# Patient Record
Sex: Female | Born: 1965 | Race: White | Hispanic: No | Marital: Single | Smoking: Former smoker
Health system: Southern US, Community
[De-identification: ages and names within clinical notes are randomized; demographics above are authoritative.]

## PROBLEM LIST (undated history)

## (undated) DIAGNOSIS — D509 Iron deficiency anemia, unspecified: Secondary | ICD-10-CM

## (undated) DIAGNOSIS — N83209 Unspecified ovarian cyst, unspecified side: Secondary | ICD-10-CM

## (undated) DIAGNOSIS — K8020 Calculus of gallbladder without cholecystitis without obstruction: Secondary | ICD-10-CM

## (undated) DIAGNOSIS — Z87442 Personal history of urinary calculi: Secondary | ICD-10-CM

## (undated) DIAGNOSIS — G894 Chronic pain syndrome: Secondary | ICD-10-CM

## (undated) DIAGNOSIS — E7800 Pure hypercholesterolemia, unspecified: Secondary | ICD-10-CM

## (undated) DIAGNOSIS — M419 Scoliosis, unspecified: Secondary | ICD-10-CM

## (undated) DIAGNOSIS — F329 Major depressive disorder, single episode, unspecified: Secondary | ICD-10-CM

## (undated) DIAGNOSIS — R5383 Other fatigue: Secondary | ICD-10-CM

## (undated) DIAGNOSIS — R252 Cramp and spasm: Secondary | ICD-10-CM

## (undated) DIAGNOSIS — J302 Other seasonal allergic rhinitis: Secondary | ICD-10-CM

## (undated) DIAGNOSIS — M5136 Other intervertebral disc degeneration, lumbar region: Secondary | ICD-10-CM

## (undated) DIAGNOSIS — G43909 Migraine, unspecified, not intractable, without status migrainosus: Secondary | ICD-10-CM

## (undated) DIAGNOSIS — G44209 Tension-type headache, unspecified, not intractable: Secondary | ICD-10-CM

## (undated) DIAGNOSIS — Z720 Tobacco use: Secondary | ICD-10-CM

## (undated) DIAGNOSIS — M51369 Other intervertebral disc degeneration, lumbar region without mention of lumbar back pain or lower extremity pain: Secondary | ICD-10-CM

## (undated) DIAGNOSIS — F32A Depression, unspecified: Secondary | ICD-10-CM

## (undated) DIAGNOSIS — K219 Gastro-esophageal reflux disease without esophagitis: Secondary | ICD-10-CM

## (undated) DIAGNOSIS — M549 Dorsalgia, unspecified: Secondary | ICD-10-CM

## (undated) DIAGNOSIS — R200 Anesthesia of skin: Secondary | ICD-10-CM

## (undated) DIAGNOSIS — K5909 Other constipation: Secondary | ICD-10-CM

## (undated) DIAGNOSIS — F411 Generalized anxiety disorder: Secondary | ICD-10-CM

## (undated) HISTORY — DX: Depression, unspecified: F32.A

## (undated) HISTORY — PX: LAMINECTOMY: SHX219

## (undated) HISTORY — DX: Scoliosis, unspecified: M41.9

## (undated) HISTORY — PX: CHOLECYSTECTOMY: SHX55

## (undated) HISTORY — DX: Gastro-esophageal reflux disease without esophagitis: K21.9

## (undated) HISTORY — DX: Other intervertebral disc degeneration, lumbar region: M51.36

## (undated) HISTORY — DX: Tobacco use: Z72.0

## (undated) HISTORY — PX: MANDIBLE FRACTURE SURGERY: SHX706

## (undated) HISTORY — DX: Other fatigue: R53.83

## (undated) HISTORY — DX: Major depressive disorder, single episode, unspecified: F32.9

## (undated) HISTORY — DX: Other constipation: K59.09

## (undated) HISTORY — DX: Generalized anxiety disorder: F41.1

## (undated) HISTORY — DX: Other intervertebral disc degeneration, lumbar region without mention of lumbar back pain or lower extremity pain: M51.369

## (undated) HISTORY — DX: Iron deficiency anemia, unspecified: D50.9

## (undated) HISTORY — PX: ABDOMINAL HYSTERECTOMY: SHX81

## (undated) HISTORY — PX: LAPAROSCOPIC OVARIAN CYSTECTOMY: SHX6248

## (undated) HISTORY — DX: Other seasonal allergic rhinitis: J30.2

## (undated) HISTORY — DX: Calculus of gallbladder without cholecystitis without obstruction: K80.20

## (undated) HISTORY — DX: Tension-type headache, unspecified, not intractable: G44.209

---

## 2004-09-04 ENCOUNTER — Emergency Department: Payer: Self-pay | Admitting: Emergency Medicine

## 2005-03-08 ENCOUNTER — Emergency Department: Payer: Self-pay | Admitting: Unknown Physician Specialty

## 2005-10-18 ENCOUNTER — Emergency Department: Payer: Self-pay | Admitting: Emergency Medicine

## 2005-11-26 ENCOUNTER — Emergency Department: Payer: Self-pay | Admitting: Emergency Medicine

## 2006-06-14 ENCOUNTER — Emergency Department: Payer: Self-pay | Admitting: Emergency Medicine

## 2006-07-27 ENCOUNTER — Emergency Department: Payer: Self-pay | Admitting: Emergency Medicine

## 2006-08-14 ENCOUNTER — Inpatient Hospital Stay: Payer: Self-pay | Admitting: Psychiatry

## 2007-02-28 ENCOUNTER — Inpatient Hospital Stay: Payer: Self-pay | Admitting: Internal Medicine

## 2007-08-26 ENCOUNTER — Ambulatory Visit: Payer: Self-pay | Admitting: Neurology

## 2007-11-26 ENCOUNTER — Emergency Department: Payer: Self-pay | Admitting: Emergency Medicine

## 2007-11-28 ENCOUNTER — Emergency Department: Payer: Self-pay | Admitting: Emergency Medicine

## 2008-03-14 ENCOUNTER — Inpatient Hospital Stay: Payer: Self-pay | Admitting: Internal Medicine

## 2008-03-14 ENCOUNTER — Other Ambulatory Visit: Payer: Self-pay

## 2008-03-29 ENCOUNTER — Other Ambulatory Visit: Payer: Self-pay

## 2008-03-29 ENCOUNTER — Emergency Department: Payer: Self-pay | Admitting: Emergency Medicine

## 2008-07-05 ENCOUNTER — Emergency Department (HOSPITAL_COMMUNITY): Admission: EM | Admit: 2008-07-05 | Discharge: 2008-07-05 | Payer: Self-pay | Admitting: Emergency Medicine

## 2008-07-25 ENCOUNTER — Ambulatory Visit: Payer: Self-pay | Admitting: Family Medicine

## 2008-08-13 ENCOUNTER — Emergency Department (HOSPITAL_COMMUNITY): Admission: EM | Admit: 2008-08-13 | Discharge: 2008-08-13 | Payer: Self-pay | Admitting: Emergency Medicine

## 2008-08-14 ENCOUNTER — Emergency Department (HOSPITAL_COMMUNITY): Admission: EM | Admit: 2008-08-14 | Discharge: 2008-08-14 | Payer: Self-pay | Admitting: *Deleted

## 2008-08-31 ENCOUNTER — Encounter: Admission: RE | Admit: 2008-08-31 | Discharge: 2008-08-31 | Payer: Self-pay | Admitting: Orthopaedic Surgery

## 2008-09-10 ENCOUNTER — Emergency Department (HOSPITAL_COMMUNITY): Admission: EM | Admit: 2008-09-10 | Discharge: 2008-09-11 | Payer: Self-pay | Admitting: Emergency Medicine

## 2008-09-22 ENCOUNTER — Emergency Department (HOSPITAL_COMMUNITY): Admission: EM | Admit: 2008-09-22 | Discharge: 2008-09-22 | Payer: Self-pay | Admitting: Emergency Medicine

## 2008-09-26 ENCOUNTER — Ambulatory Visit: Payer: Self-pay | Admitting: Family Medicine

## 2008-10-18 ENCOUNTER — Inpatient Hospital Stay (HOSPITAL_COMMUNITY): Admission: EM | Admit: 2008-10-18 | Discharge: 2008-10-20 | Payer: Self-pay | Admitting: Emergency Medicine

## 2008-10-20 ENCOUNTER — Inpatient Hospital Stay (HOSPITAL_COMMUNITY): Admission: RE | Admit: 2008-10-20 | Discharge: 2008-10-22 | Payer: Self-pay | Admitting: *Deleted

## 2008-10-20 ENCOUNTER — Ambulatory Visit: Payer: Self-pay | Admitting: *Deleted

## 2008-12-05 ENCOUNTER — Emergency Department (HOSPITAL_COMMUNITY): Admission: EM | Admit: 2008-12-05 | Discharge: 2008-12-05 | Payer: Self-pay | Admitting: Emergency Medicine

## 2009-05-10 ENCOUNTER — Ambulatory Visit: Payer: Self-pay | Admitting: Family Medicine

## 2009-07-27 ENCOUNTER — Ambulatory Visit: Payer: Self-pay | Admitting: Gastroenterology

## 2009-10-24 ENCOUNTER — Ambulatory Visit: Payer: Self-pay | Admitting: Orthopaedic Surgery

## 2009-11-09 ENCOUNTER — Ambulatory Visit: Payer: Self-pay | Admitting: Gastroenterology

## 2009-11-20 ENCOUNTER — Encounter: Admission: RE | Admit: 2009-11-20 | Discharge: 2009-11-20 | Payer: Self-pay | Admitting: Orthopaedic Surgery

## 2010-04-01 ENCOUNTER — Ambulatory Visit: Payer: Self-pay | Admitting: Internal Medicine

## 2010-04-17 ENCOUNTER — Ambulatory Visit: Payer: Self-pay | Admitting: Internal Medicine

## 2010-04-26 ENCOUNTER — Ambulatory Visit: Payer: Self-pay | Admitting: Internal Medicine

## 2010-06-13 ENCOUNTER — Ambulatory Visit: Payer: Self-pay | Admitting: Surgery

## 2010-06-20 ENCOUNTER — Ambulatory Visit: Payer: Self-pay | Admitting: Surgery

## 2010-09-27 ENCOUNTER — Ambulatory Visit: Payer: Self-pay | Admitting: Orthopaedic Surgery

## 2010-11-14 ENCOUNTER — Emergency Department: Payer: Self-pay | Admitting: Emergency Medicine

## 2010-11-17 ENCOUNTER — Inpatient Hospital Stay: Payer: Self-pay | Admitting: Psychiatry

## 2010-12-12 ENCOUNTER — Emergency Department: Payer: Self-pay | Admitting: Emergency Medicine

## 2010-12-23 ENCOUNTER — Encounter: Payer: Self-pay | Admitting: Neurosurgery

## 2010-12-23 ENCOUNTER — Encounter: Payer: Self-pay | Admitting: Orthopaedic Surgery

## 2011-04-16 NOTE — Discharge Summary (Signed)
Megan Roth, Megan Roth            ACCOUNT NO.:  [id number]   MEDICAL RECORD NO.:  [id number]          PATIENT TYPE:  INP   LOCATION:  IC06                          FACILITY:  [facility]   PHYSICIAN:  Lonia Blood, M.D.      DATE OF BIRTH:  May 22, 1966   DATE OF ADMISSION:  10/18/2008  DATE OF DISCHARGE:  11/19/2009LH                               DISCHARGE SUMMARY   PRIMARY CARE PHYSICIAN:  The patient unassigned to [place].   DISCHARGE DIAGNOSES:  1. Ventilator-dependent respiratory failure secondary to drug      overdose.  2. Drug overdose; presumed OxyContin and benzodiazepines.  3. Hypokalemia.  4. Normocytic anemia.  5. Gastroesophageal reflux disease.  6. Degenerative disk disease of the back with chronic back pain.   DISCHARGE MEDICATIONS:  1. Zanaflex 4 mg 2-3 times daily.  2. Albuterol inhaler as needed.  3. Protonix 40 mg daily.  4. Lamictal 300 mg p.o. b.i.d.  5. Premarin 1.25 mg daily.  6. Klonopin 2 mg p.o. b.i.d.  7. Calcium 1200 mg daily.  8. Vitamin D 400 mg daily.   DISPOSITION:  The patient will be transferred to inpatient psych unit  for further management due to a drug overdose.  She is currently stable.   PROCEDURES PERFORMED:  1. Satisfactory chest x-ray performed on October 18, 2008 showed      endotracheal tube in correct position.  2. Head CT without contrast on the 17th showed normal exam.  3. Another chest x-ray on November 18th showed right main stem      intubation tube and better lung inflation, but no acute pulmonary      findings.   CONSULTATIONS:  1. The ACT Team with psych management.  2. Dr.  Juanetta Gosling for ventilator management.   BRIEF HISTORY AND PHYSICAL:  Please refer to dictated history and  physical by Dr. Dorris Singh on admission.  In short, however, this  is a 45 year old female that was brought into the emergency room  secondary to drug overdose.  The patient was observed by her boyfriend  to take a number of pills, emptied all  medication.  She has been on  OxyContin for low back pain and other medications.  In the ED, the  patient was found to be obtunded.  She was having some respiratory  compromise, and she was intubated instantly for airway protection.  Her  urine drug screen was positive for opiates and enzymes.  She also has  acetaminophen level initially high, but gradually improved to less than  10.  The patient was subsequently admitted to the ICU for further  management.   HOSPITAL COURSE:  1. Ventilatory-dependent respiratory failure.  This was mainly for      respiratory protection.  The patient responded to ventilatory care.      Within 24 hours she was extubated with her respiratory parameters      intact.  She has been awake since then.  Has been eating      adequately, and has been breathing without problems.  2. Drug overdose.  The patient was apparently under a lot of stress.  She attempted suicide by taking multiple pills.  Family have been      around and boyfriend too has been around who actually witnessed      what had happened.  They have up all hours in the hospital.  She      has had some strained relations with her family apparently for a      long time and the boyfriend they have been together for only a      couple of months.  At this point, however, she is still suicidal.      She has been evaluated by the ACT team, and she will be transferred      to inpatient psych unit for further management.  3. Hypokalemia.  This probably came from some nausea and vomiting.      The patient is getting potassium repleted.  4. Normocytic anemia.  Will continue to monitor the patient's      hemoglobin as of this time.  5.  GERD.  The patient is on PPI, and      will continue to treat her with a PPI at this point.  Otherwise,      further management will be done at the psychiatric facility the      patient will be transferred to.      Lonia Blood, M.D.  Electronically Signed      LG/MEDQ  D:  10/20/2008  T:  10/20/2008  Job:  [id number]

## 2011-04-16 NOTE — H&P (Signed)
Megan Roth, Megan Roth            ACCOUNT NO.:  [id number]   MEDICAL RECORD NO.:  [id number]          PATIENT TYPE:  INP   LOCATION:  IC06                          FACILITY:  [facility]   PHYSICIAN:  Dorris Singh, DO    DATE OF BIRTH:  Feb 21, 1966   DATE OF ADMISSION:  10/18/2008  DATE OF DISCHARGE:  LH                              HISTORY & PHYSICAL   CHIEF COMPLAINT:  Witnessed overdose.   HISTORY:  The patient is a 45 year old Caucasian female who presented to  [facility] via EMS due to she was unresponsive, altered mental  status and uncooperative.  The patient's boyfriend, the history was  obtained by him.  He states that today he talked to the patient this  morning about going to an appointment with him and they argued.  He sat  on the bed. While he was sitting on the bed, he heard her open up her  pill bottle and he did not turn around to look at her.  Then he heard  her open up her second pill bottle.  When he turned around to look at  her, he saw her literally dumping her pill bottles all the contents into  her mouth.  Next to her was an empty pill bottle.  There is a suspicion  that she has taken over 80 pills, each one had a minimum of 40 pills per  bottle per boyfriend.   PAST MEDICAL HISTORY:  1. The boyfriend also states that as far as her past medical history,      she does have a history of asthma.  2. Chronic back pain.  3. Degenerative disk disease.  4. Gastroesophageal reflux disease.   SURGICAL HISTORY:  1. She has had back surgery.  2. Hysterectomy.   SOCIAL HISTORY:  She smokes about 1-1/2 packs a day.  She is a  nondrinker.  No drug abuse.  Currently lives with her boyfriend.  She  has 3 daughters.  According to the boyfriend, she is not very close to  her family members.  She does have 1 sister and a brother, but she is  also not close to her children.   ALLERGIES:  1. NEURONTIN.  2. SULFA.   MEDICATIONS:  1. OxyContin, there is no dose  given.  2. Zanaflex.  3. Albuterol inhaler.  4. Prevacid.  5. Tizanidine 4 mg as needed.  6. Lamotrigine 300 mg once a day.   REVIEW OF SYSTEMS:  Cannot assess due to a level 5 caveat.   PHYSICAL EXAMINATION:  VITAL SIGNS:  Heart rate of 48, blood pressure  153/98, respirations 14.  She is currently on the ventilator satting at  100%.  Currently she has a NG tube and an endotracheal tube inserted.  GENERAL:  The patient is a well-developed, well-nourished Caucasian  female.  HEART:  Huston Foley sinus.  No murmur is heard.  LUNGS:  Currently on mechanical ventilation, but clear to auscultation.  ABDOMEN:  Soft, nontender, and nondistended.  EXTREMITIES:  Positive pulses.  No edema, ecchymosis or cyanosis noted.   LABORATORY DATA:  White count of 10.1, hemoglobin  11.4, hematocrit 34.0,  platelet count of 241, sodium is 137, potassium 4.2, chloride 112, CO2  22, glucose 146, BUN 6, creatinine 0.63, albumin is 3.0, calcium 7.6,  total protein 5.4.  Her HCG test is negative.  Her acetaminophen level  was less than 10 and her salicylate level is less than 4.  Her urine  drug screen is positive for benzodiazepines and opiates.  Her urine is  negative with low specific gravity.   ASSESSMENT:  1. Intentional overdose with benzodiazepines and opiates.  2. Respiratory failure.   PLAN:  We will admit the patient to the ICU to the service of [facility].  We will implement the Diprivan protocol as well as contact the ACT team  to come evaluate her when she has been extubated.  We will do DVT and GI  prophylaxis.  We will try to maintain all multiorgan systems by IV  hydration if not medical management if needed.  I have explained to the  boyfriend that this is a very critical situation with the patient.  Due  to the amount of medications that she took, she has an increased  mortality and morbidity.  We will continue to watch the patient and  change management as appropriate.  We will consult Dr.  Juanetta Gosling for  ventilator management.      Dorris Singh, DO  Electronically Signed     CB/MEDQ  D:  10/18/2008  T:  10/18/2008  Job:  [id number]

## 2011-04-16 NOTE — Consult Note (Signed)
NAMEJOICE, NAZARIO            ACCOUNT NO.:  [id number]   MEDICAL RECORD NO.:  [id number]          PATIENT TYPE:  INP   LOCATION:  IC06                          FACILITY:  [facility]   PHYSICIAN:  Edward L. Juanetta Gosling, M.D.DATE OF BIRTH:  Jun 08, 1966   DATE OF CONSULTATION:  DATE OF DISCHARGE:                                 CONSULTATION   REASON FOR CONSULTATION:  Respiratory failure secondary to drug  overdose.   HISTORY:  Ms. Cothron is a 45 year old, who apparently took an overdose  of medications.  She was transported by EMS after her boyfriend found  her.  She was found with empty pill bottles including apparently  OxyContin, benzodiazepine, maybe Darvocet.  Since she came into the  emergency room, she was very unresponsive and she was intubated and  placed on a ventilator.  Her history is all from her medical records.   PAST MEDICAL HISTORY:  Positive for asthma, chronic low back pain,  degenerative disk disease, and gastroesophageal reflux disease.  She has  apparently had some sort of psychiatric history as well, that is not  totally clear.  Surgically, she has had a back surgery and hysterectomy.   SOCIAL HISTORY:  She does not use any alcohol or not use any illicit  drugs.  She apparently does smoke.   She states she is allergic to NEURONTIN and SULFA,. She has been on  multiple medications. I do not have the doses of any of these  medications to speak of OxyContin, Zanaflex, albuterol, Prevacid, and  Lamotrigine.  We do have a dose of 300 mg of that.  It is not clear what  else she has taken.  There is history of taking a number of Darvocet as  well.  She apparently also took Zyrtec.   PHYSICAL EXAMINATION:  GENERAL:  Shows that she is sedated.  She is  intubated.  HEENT:  Her pupils do react, but are small.  VITAL SIGNS:  Her heart rates are in the 40s, her blood pressure 147/92,  respirations are 14, and O2 sats 100%.  CHEST:  Rhonchi bilaterally.  HEART:  Regular  without gallop.  ABDOMEN:  Soft.  Her mucous membranes are moist.  EXTREMITIES:  No edema.  CENTRAL NERVOUS SYSTEM:  Apparently, she had moved all 4 extremities  earlier, but I cannot get much now, because of her sedation.   LABORATORY WORK:  CBC shows white count is 10,100, hemoglobin is 11.4,  and platelet is 241.  Urinalysis is essentially negative.  Urine  pregnancy test is negative.  Urine drug screen positive for opiates,  positive for benzodiazepines.  Acetaminophen level less than 10, which  would argue against her having taken a large amount of Darvocet-N 100.  Comprehensive metabolic profile; glucose is 146.  Electrolytes were  normal and albumin is 3, total protein is 5.4, alcohol less than 5, and  salicylate less than 4.  Blood gas on the ventilator of 100% O2, tidal  volume 500, rate of 14, 5 of PEEP shows pO2 463, pCO2 of 28.2, pH 7.46.   ASSESSMENT:  She has had multiple drug overdose.  She is intubated.  She  is on the ventilator and I do not think we have any opportunity to make  any changes with that now except to reduce her FIO2.  Hopefully, she  will wake up and will be able to look at making some effort of  extubation tomorrow.  I will not be here tomorrow, but will return on  the October 20, 2008.  Thanks for allowing me to see her with you.      Edward L. Juanetta Gosling, M.D.  Electronically Signed     ELH/MEDQ  D:  10/18/2008  T:  10/19/2008  Job:  [id number]

## 2011-04-16 NOTE — Group Therapy Note (Signed)
Megan Roth, Megan Roth            ACCOUNT NO.:  [id number]   MEDICAL RECORD NO.:  [id number]          PATIENT TYPE:  INP   LOCATION:  IC06                          FACILITY:  [facility]   PHYSICIAN:  Edward L. Juanetta Gosling, M.D.DATE OF BIRTH:  [birth year]   DATE OF PROCEDURE:  DATE OF DISCHARGE:                                 PROGRESS NOTE   The patient of the [facility] Hospitalist Team.  Megan Roth was able to  be extubated yesterday and plans are made for her to be transferred to  the [facility] when beds available.  White count 9200,  hemoglobin 9.9, and platelets 182.  Comp metabolic profile shows  potassium is 3.1 and albumin is 2.5.  Her blood gas shows pO2 is 87,  pCO2 of 42, and pH 7.35.   ASSESSMENT:  She has had a overdose.  She is off the ventilator.  Plans  are made for transfer to [facility] and I am going to plan  to sign off.  Thanks for allowing me to see her with you.      Edward L. Juanetta Gosling, M.D.  Electronically Signed     ELH/MEDQ  D:  10/20/2008  T:  10/20/2008  Job:  [id number]
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
## 2011-08-29 ENCOUNTER — Inpatient Hospital Stay: Payer: Self-pay | Admitting: Psychiatry

## 2011-09-03 LAB — URINALYSIS, ROUTINE W REFLEX MICROSCOPIC
Bilirubin Urine: NEGATIVE
Hgb urine dipstick: NEGATIVE
Ketones, ur: NEGATIVE
Protein, ur: NEGATIVE
Urobilinogen, UA: 0.2

## 2011-09-04 LAB — URINE CULTURE
Colony Count: NO GROWTH
Culture: NO GROWTH

## 2011-09-04 LAB — COMPREHENSIVE METABOLIC PANEL
ALT: 15
AST: 16
Albumin: 3 — ABNORMAL LOW
BUN: 6
CO2: 25
Calcium: 7.6 — ABNORMAL LOW
Chloride: 113 — ABNORMAL HIGH
Creatinine, Ser: 0.56
Creatinine, Ser: 0.63
GFR calc Af Amer: >60
GFR calc non Af Amer: >60
Glucose, Bld: 146 — ABNORMAL HIGH
Total Bilirubin: 0.7
Total Protein: 5.4 — ABNORMAL LOW

## 2011-09-04 LAB — CBC
HCT: 34 — ABNORMAL LOW
Hemoglobin: 11.4 — ABNORMAL LOW
Hemoglobin: 9.9 — ABNORMAL LOW
MCHC: 33.5
MCHC: 33.9
MCV: 88.4
MCV: 89.4
MCV: 89.9
RBC: 3.24 — ABNORMAL LOW
RBC: 3.8 — ABNORMAL LOW
RBC: 3.92
RDW: 14.6
RDW: 14.8
WBC: 9.2

## 2011-09-04 LAB — BLOOD GAS, ARTERIAL
Acid-base deficit: 2.7 — ABNORMAL HIGH
Acid-base deficit: 3.8 — ABNORMAL HIGH
Bicarbonate: 19.9 — ABNORMAL LOW
Bicarbonate: 20.7
Bicarbonate: 21.1
FIO2: 40
FIO2: 40
O2 Content: 3
O2 Saturation: 96.3
O2 Saturation: 98.3
O2 Saturation: 98.8
PEEP: 5
Patient temperature: 37
Pressure support: 5
TCO2: 17.8
TCO2: 19.2
TCO2: 19.5
pCO2 arterial: 28.2 — ABNORMAL LOW
pCO2 arterial: 33.3 — ABNORMAL LOW
pH, Arterial: 7.463 — ABNORMAL HIGH
pO2, Arterial: 124 — ABNORMAL HIGH
pO2, Arterial: 137 — ABNORMAL HIGH
pO2, Arterial: 463 — ABNORMAL HIGH

## 2011-09-04 LAB — DIFFERENTIAL
Basophils Absolute: 0
Basophils Absolute: 0.1
Basophils Relative: 0
Basophils Relative: 1
Eosinophils Absolute: 0
Eosinophils Absolute: 0.1
Eosinophils Absolute: 0.1
Eosinophils Relative: 0
Lymphocytes Relative: 34
Lymphs Abs: 3.3
Monocytes Relative: 4
Monocytes Relative: 6
Neutro Abs: 6
Neutrophils Relative %: 60
Neutrophils Relative %: 66

## 2011-09-04 LAB — BASIC METABOLIC PANEL
BUN: 6
CO2: 23
Calcium: 7.5 — ABNORMAL LOW
Creatinine, Ser: 0.61
GFR calc non Af Amer: >60
Glucose, Bld: 84
Sodium: 136

## 2011-09-04 LAB — CARDIAC PANEL(CRET KIN+CKTOT+MB+TROPI)
CK, MB: 1.1
CK, MB: 1.2
CK, MB: 1.4
Relative Index: INVALID
Relative Index: INVALID
Total CK: 55
Total CK: 74
Troponin I: 0.03
Troponin I: 0.07 — ABNORMAL HIGH

## 2011-09-04 LAB — URINALYSIS, ROUTINE W REFLEX MICROSCOPIC
Bilirubin Urine: NEGATIVE
Ketones, ur: NEGATIVE
Nitrite: NEGATIVE
Specific Gravity, Urine: <1.005 — ABNORMAL LOW
Urobilinogen, UA: 0.2
pH: 6.5

## 2011-09-04 LAB — PREGNANCY, URINE

## 2011-09-04 LAB — ACETAMINOPHEN LEVEL

## 2011-09-04 LAB — TRIGLYCERIDES: Triglycerides: 104

## 2011-09-04 LAB — CHOLESTEROL, TOTAL

## 2011-09-04 LAB — RAPID URINE DRUG SCREEN, HOSP PERFORMED: Benzodiazepines: POSITIVE — AB

## 2011-09-04 LAB — PHOSPHORUS: Phosphorus: 2.9

## 2011-09-04 LAB — SALICYLATE LEVEL: Salicylate Lvl: <4

## 2011-10-22 ENCOUNTER — Ambulatory Visit: Payer: Self-pay | Admitting: Neurosurgery

## 2011-10-31 ENCOUNTER — Ambulatory Visit: Payer: Self-pay | Admitting: Internal Medicine

## 2011-11-11 ENCOUNTER — Ambulatory Visit: Payer: Self-pay | Admitting: Internal Medicine

## 2011-11-25 ENCOUNTER — Emergency Department: Payer: Self-pay | Admitting: Internal Medicine

## 2011-11-26 ENCOUNTER — Emergency Department: Payer: Self-pay | Admitting: Emergency Medicine

## 2011-12-18 ENCOUNTER — Emergency Department: Payer: Self-pay

## 2012-01-06 ENCOUNTER — Emergency Department: Payer: Self-pay | Admitting: Emergency Medicine

## 2012-01-06 LAB — DRUG SCREEN, URINE
Amphetamines, Ur Screen: NEGATIVE (ref ?–1000)
Benzodiazepine, Ur Scrn: POSITIVE (ref ?–200)
Cannabinoid 50 Ng, Ur ~~LOC~~: NEGATIVE (ref ?–50)
Tricyclic, Ur Screen: NEGATIVE (ref ?–1000)

## 2012-01-06 LAB — URINALYSIS, COMPLETE
Ketone: NEGATIVE
Nitrite: NEGATIVE
Protein: NEGATIVE

## 2012-01-06 LAB — COMPREHENSIVE METABOLIC PANEL
Alkaline Phosphatase: 100 U/L (ref 50–136)
Anion Gap: 14 (ref 7–16)
Calcium, Total: 8.9 mg/dL (ref 8.5–10.1)
Chloride: 105 mmol/L (ref 98–107)
Co2: 25 mmol/L (ref 21–32)
EGFR (African American): >60
Glucose: 109 mg/dL — ABNORMAL HIGH (ref 65–99)
Osmolality: 284 (ref 275–301)
SGPT (ALT): 12 U/L

## 2012-01-06 LAB — ETHANOL: Ethanol %: <0.003 % (ref 0.000–0.080)

## 2012-01-06 LAB — CBC
HCT: 41.5 % (ref 35.0–47.0)
HGB: 13.9 g/dL (ref 12.0–16.0)
MCH: 28.9 pg (ref 26.0–34.0)
MCHC: 33.4 g/dL (ref 32.0–36.0)
RBC: 4.8 10*6/uL (ref 3.80–5.20)

## 2012-01-06 LAB — TROPONIN I: Troponin-I: <0.02 ng/mL

## 2012-03-31 ENCOUNTER — Emergency Department: Payer: Self-pay | Admitting: Emergency Medicine

## 2012-04-02 ENCOUNTER — Emergency Department: Payer: Self-pay | Admitting: Emergency Medicine

## 2012-09-03 ENCOUNTER — Emergency Department: Payer: Self-pay | Admitting: Emergency Medicine

## 2012-09-03 LAB — LIPASE, BLOOD: Lipase: 193 U/L (ref 73–393)

## 2012-09-03 LAB — COMPREHENSIVE METABOLIC PANEL
Alkaline Phosphatase: 140 U/L — ABNORMAL HIGH (ref 50–136)
Bilirubin,Total: 0.7 mg/dL (ref 0.2–1.0)
Chloride: 104 mmol/L (ref 98–107)
Co2: 22 mmol/L (ref 21–32)
Creatinine: 0.93 mg/dL (ref 0.60–1.30)
EGFR (African American): >60
EGFR (Non-African Amer.): >60
Osmolality: 282 (ref 275–301)
Sodium: 141 mmol/L (ref 136–145)
Total Protein: 8.3 g/dL — ABNORMAL HIGH (ref 6.4–8.2)

## 2012-09-03 LAB — URINALYSIS, COMPLETE
Glucose,UR: 50 mg/dL (ref 0–75)
Nitrite: POSITIVE
Ph: 5 (ref 4.5–8.0)
Protein: NEGATIVE

## 2012-09-03 LAB — CBC
HCT: 45.6 % (ref 35.0–47.0)
HGB: 15.7 g/dL (ref 12.0–16.0)
MCHC: 34.5 g/dL (ref 32.0–36.0)
WBC: 15.3 10*3/uL — ABNORMAL HIGH (ref 3.6–11.0)

## 2012-11-29 ENCOUNTER — Inpatient Hospital Stay: Payer: Self-pay | Admitting: Psychiatry

## 2012-11-29 LAB — CBC
HCT: 44.4 % (ref 35.0–47.0)
HGB: 14.6 g/dL (ref 12.0–16.0)
MCHC: 32.8 g/dL (ref 32.0–36.0)
RBC: 5.18 10*6/uL (ref 3.80–5.20)

## 2012-11-29 LAB — COMPREHENSIVE METABOLIC PANEL
Albumin: 3.6 g/dL (ref 3.4–5.0)
Alkaline Phosphatase: 129 U/L (ref 50–136)
BUN: 10 mg/dL (ref 7–18)
Bilirubin,Total: 0.3 mg/dL (ref 0.2–1.0)
Calcium, Total: 9 mg/dL (ref 8.5–10.1)
Glucose: 113 mg/dL — ABNORMAL HIGH (ref 65–99)
SGOT(AST): 19 U/L (ref 15–37)
SGPT (ALT): 17 U/L (ref 12–78)

## 2012-11-29 LAB — DRUG SCREEN, URINE
Amphetamines, Ur Screen: NEGATIVE (ref ?–1000)
Benzodiazepine, Ur Scrn: POSITIVE (ref ?–200)
Cannabinoid 50 Ng, Ur ~~LOC~~: NEGATIVE (ref ?–50)
Cocaine Metabolite,Ur ~~LOC~~: NEGATIVE (ref ?–300)
MDMA (Ecstasy)Ur Screen: NEGATIVE (ref ?–500)
Opiate, Ur Screen: POSITIVE (ref ?–300)
Phencyclidine (PCP) Ur S: NEGATIVE (ref ?–25)

## 2012-11-29 LAB — URINALYSIS, COMPLETE
Blood: NEGATIVE
Glucose,UR: NEGATIVE mg/dL (ref 0–75)
Leukocyte Esterase: NEGATIVE
Nitrite: NEGATIVE
Protein: NEGATIVE
Specific Gravity: 1.004 (ref 1.003–1.030)
WBC UR: <1 /HPF (ref 0–5)

## 2012-11-29 LAB — ETHANOL: Ethanol: <3 mg/dL

## 2013-02-23 ENCOUNTER — Ambulatory Visit: Payer: Self-pay | Admitting: Internal Medicine

## 2013-09-23 ENCOUNTER — Ambulatory Visit: Payer: Self-pay | Admitting: Internal Medicine

## 2013-10-15 ENCOUNTER — Emergency Department: Payer: Self-pay

## 2013-11-20 ENCOUNTER — Emergency Department: Payer: Self-pay | Admitting: Emergency Medicine

## 2013-11-20 LAB — BASIC METABOLIC PANEL
Anion Gap: 6 — ABNORMAL LOW (ref 7–16)
BUN: 4 mg/dL — ABNORMAL LOW (ref 7–18)
Co2: 32 mmol/L (ref 21–32)
Creatinine: 0.72 mg/dL (ref 0.60–1.30)
EGFR (Non-African Amer.): >60
Glucose: 101 mg/dL — ABNORMAL HIGH (ref 65–99)
Osmolality: 275 (ref 275–301)
Potassium: 3.4 mmol/L — ABNORMAL LOW (ref 3.5–5.1)

## 2013-11-20 LAB — CBC WITH DIFFERENTIAL/PLATELET
Eosinophil #: 0.1 10*3/uL (ref 0.0–0.7)
Eosinophil %: 0.5 %
Lymphocyte #: 3.6 10*3/uL (ref 1.0–3.6)
MCV: 86 fL (ref 80–100)
Monocyte #: 0.7 x10 3/mm (ref 0.2–0.9)
Platelet: 329 10*3/uL (ref 150–440)

## 2013-12-31 ENCOUNTER — Emergency Department: Payer: Self-pay | Admitting: Emergency Medicine

## 2013-12-31 LAB — URINALYSIS, COMPLETE
BILIRUBIN, UR: NEGATIVE
Blood: NEGATIVE
Glucose,UR: NEGATIVE mg/dL (ref 0–75)
Ketone: NEGATIVE
Leukocyte Esterase: NEGATIVE
Nitrite: NEGATIVE
PH: 8 (ref 4.5–8.0)
PROTEIN: NEGATIVE
SPECIFIC GRAVITY: 1.012 (ref 1.003–1.030)
WBC UR: NONE SEEN /HPF (ref 0–5)

## 2013-12-31 LAB — BASIC METABOLIC PANEL
Anion Gap: 10 (ref 7–16)
BUN: 8 mg/dL (ref 7–18)
Calcium, Total: 9.9 mg/dL (ref 8.5–10.1)
Chloride: 105 mmol/L (ref 98–107)
Co2: 23 mmol/L (ref 21–32)
Creatinine: 0.68 mg/dL (ref 0.60–1.30)
EGFR (African American): >60
EGFR (Non-African Amer.): >60
Glucose: 100 mg/dL — ABNORMAL HIGH (ref 65–99)
Osmolality: 274 (ref 275–301)
Potassium: 3.5 mmol/L (ref 3.5–5.1)
Sodium: 138 mmol/L (ref 136–145)

## 2013-12-31 LAB — DRUG SCREEN, URINE
Amphetamines, Ur Screen: NEGATIVE (ref ?–1000)
BENZODIAZEPINE, UR SCRN: POSITIVE (ref ?–200)
Barbiturates, Ur Screen: NEGATIVE (ref ?–200)
COCAINE METABOLITE, UR ~~LOC~~: NEGATIVE (ref ?–300)
Cannabinoid 50 Ng, Ur ~~LOC~~: NEGATIVE (ref ?–50)
MDMA (ECSTASY) UR SCREEN: NEGATIVE (ref ?–500)
Methadone, Ur Screen: NEGATIVE (ref ?–300)
Opiate, Ur Screen: POSITIVE (ref ?–300)
PHENCYCLIDINE (PCP) UR S: NEGATIVE (ref ?–25)
TRICYCLIC, UR SCREEN: POSITIVE (ref ?–1000)

## 2013-12-31 LAB — CBC WITH DIFFERENTIAL/PLATELET
Basophil #: 0.1 10*3/uL (ref 0.0–0.1)
Basophil %: 1.2 %
Eosinophil #: 0 10*3/uL (ref 0.0–0.7)
Eosinophil %: 0.3 %
HCT: 46.7 % (ref 35.0–47.0)
HGB: 15.4 g/dL (ref 12.0–16.0)
Lymphocyte #: 3.3 10*3/uL (ref 1.0–3.6)
Lymphocyte %: 26.4 %
MCH: 28.6 pg (ref 26.0–34.0)
MCHC: 33.1 g/dL (ref 32.0–36.0)
MCV: 87 fL (ref 80–100)
Monocyte #: 0.6 x10 3/mm (ref 0.2–0.9)
Monocyte %: 5 %
Neutrophil #: 8.4 10*3/uL — ABNORMAL HIGH (ref 1.4–6.5)
Neutrophil %: 67.1 %
Platelet: 363 10*3/uL (ref 150–440)
RBC: 5.4 10*6/uL — ABNORMAL HIGH (ref 3.80–5.20)
RDW: 14.9 % — ABNORMAL HIGH (ref 11.5–14.5)
WBC: 12.5 10*3/uL — ABNORMAL HIGH (ref 3.6–11.0)

## 2014-04-12 ENCOUNTER — Emergency Department: Payer: Self-pay | Admitting: Emergency Medicine

## 2014-07-28 ENCOUNTER — Ambulatory Visit: Payer: Self-pay | Admitting: Physician Assistant

## 2014-09-20 ENCOUNTER — Emergency Department: Payer: Self-pay | Admitting: Emergency Medicine

## 2014-10-15 ENCOUNTER — Ambulatory Visit: Payer: Self-pay | Admitting: Pain Medicine

## 2015-02-18 ENCOUNTER — Emergency Department: Payer: Self-pay | Admitting: Emergency Medicine

## 2015-03-24 NOTE — H&P (Signed)
PATIENT NAME:  Megan Roth, Megan Roth MR#:  [id number] DATE OF BIRTH:  Apr 25, 1966  DATE OF ADMISSION:  11/29/2012  INITIAL ASSESSMENT AND PSYCHIATRIC EVALUATION    IDENTIFYING INFORMATION: The patient is a 49 year old white female, not employed and last worked many years ago, divorced for over 46 years and lives by herself.   CHIEF COMPLAINT: The patient comes for admission to [facility] with a chief complaint, "My friend thinks I need help for my nerves." According to information obtained from the Emergency Room, the patient has been stating that she had been depressed for a few weeks and has not been sleeping and denies wanting to hurt herself but wants to go to sleep and never wake up. She has been having lots of problems and is being followed for her chronic pain in the back.   HISTORY OF PRESENT ILLNESS: When the patient was asked when she last felt well, she remembers probably a couple of weeks ago.  She started feeling upset and nervous about certain things in life, and when she was asked about details she said, "payment of bills mostly," getting over events during the holiday season.    PAST PSYCHIATRIC HISTORY: Long history of mental illness.  She was diagnosed with PTSD as well as depression in the past.  She has overdosed and abused narcotics and benzodiazepines in the past.  She admits that she is being followed by Dr. Kasandra Knudsen at [facility], last appointment was a month ago, next appointment is coming up in 1 month.  She was last inpatient at [facility] in September 2012 and was discharged on October 2012 after she was treated by Dr. Weber Cooks.  She was diagnosed with major depressive disorder, recurrent, and she was given medications; however, the patient reports that she has not been taking the medications that she was prescribed.  He prescribed her MS Contin 30 mg twice a day, which was lowered from the dose that she came in; however, she reports that she has  been getting Kadian 60 mg twice a day from [facility] in [place].  In addition, she was restarted on valium 10 mg twice a day, and the patient reports that she has been taking valium 10 mg three times a day. In addition, she is taking tramadol 1 tablet three times a day. Evidently, there is increase in all the pain killers and benzodiazepine medications since her last discharge from the hospital. The patient reports that she is being followed at [facility] [place].  Last appointment was in November 2013.  Next appointment is coming up in January 2014. It looks like all of the medications were increased, especially the opioids and valium. The patient reports that she had 3 back surgeries, and she is in chronic pain.   FAMILY HISTORY OF MENTAL ILLNESS: Sister has mental illness and she is on lithium.  No known history of completed suicides in the family.   SOCIAL HISTORY: Currently she lives by herself, has been divorced for 20 years.  She graduated from high school.  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  She last worked 7 or 8 years ago and has not been employed in many years.   MARRIAGES: She was married once and divorced for 20 years.  She has 3 children.  They are grown and gone.  She is not in touch with her children and  lives by herself.   MEDICAL HISTORY:  She had 3 back surgeries and is in chronic pain.  She has GERD and takes Premarin on a regular basis.   MEDICATIONS: At the time of discharge from [facility] on 09/03/2011 are as follows: Premarin 0.625 mg alternating with 1.25 mg on alternating days, diazepam (valium) 10 mg every 12 hours, Nexium 40 mg per day, Prozac 30 mg per day-which she has not been taking, MS Contin 30 mg twice a day, Zanaflex 4 to 8 mg every 8 hours p.r.n. for pain, trazodone 200 mg at bedtime, MiraLAX 17 grams daily for constipation, Colace 200 mg twice a day for  constipation.  As stated, the patient had been taking tramadol, tizanidine and Kadian, and I will leave to the discretion of the floor physician to start these medications.   PRIMARY CARE PHYSICIAN: The patient is being followed by [facility] sometime ago, 4 months.  Next appointment is to be made.   PHYSICAL EXAMINATION:  VITAL SIGNS: Temperature 98.4, pulse is 90 per minute, respirations 18 per minute and regular, blood pressure is 130/80 mmHg.   HEENT: Head is normocephalic, atraumatic. Eyes: PERRLA. Fundi are bilaterally benign.   NECK: Supple without any organomegaly or thyromegaly.  CHEST: Normal expansion, normal breath sounds.  HEART: Normal S1, S2 without any murmurs or gallops.  LUNGS: Clear to auscultation.  ABDOMEN: Soft, no organomegaly.  BACK: Surgery scars, healed well.  RECTAL: Deferred.  PELVIC: Deferred.  NEUROLOGICAL:  Gait is normal.  Romberg is negative. Cranial nerves II through XII are grossly intact. DTRs are 2+ and plantars normal response.   MENTAL STATUS EXAMINATION:  The patient is dressed in street clothes, alert and oriented to place, person and time.  Affect is flat with mood sad and depressed.  She feels low and down about being in chronic pain and not having any support at home.  She admits feeling hopeless and helpless, and worthless and useless at times. She became teary-eyed while talking about her problems.  No psychosis. Cognition is intact.  General knowledge and information is fair.  She denies any ideas or plans to hurt herself or others.  No psychosis.  She does want to get help for her problems.   IMPRESSION:  AXIS I:   1. Major depressive disorder, recurrent.  2. History of benzodiazepine abuse and opioid abuse.   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  AXIS III:    1. Chronic pain secondary to status post 3 back surgeries.  2. Status post hysterectomy and replacement hormone therapy.   3. Gastroesophageal reflux disease. 4. Chronic constipation due to opiate use in the past.   AXIS IV:  Severe-Chronic social stress, and financial problems, and occupational problems and chronic pain.   AXIS V:   Global Assessment of Functioning score is 30.   PLAN: The patient is admitted to [facility] for close observation and management.  She will be started back on the medications on which she was discharged by Dr. Weber Cooks in October 2012.  It is at the discretion of the floor physician to start the patient back on all of the pain medications that she admits that she has been taking from the pain clinic.  During her stay in the hospital, she will be given milieu therapy and supportive counseling.  She will     take part in individual and group therapy where coping skills and dealing with stresses of life will be discussed.  Proper follow-up appointments will be made after the patient is stabilized.  ____________________________ Wallace Cullens. Franchot Mimes, MD skc:cb D: 11/29/2012 17:08:14 ET T: 11/30/2012 11:54:12 ET JOB#: [id number]  cc: Arlyn Leak K. Franchot Mimes, MD, <Dictator> Dewain Penning MD ELECTRONICALLY SIGNED 12/02/2012 20:34

## 2015-12-07 ENCOUNTER — Emergency Department
Admission: EM | Admit: 2015-12-07 | Discharge: 2015-12-07 | Disposition: A | Discharge status: Home / Self Care | Payer: Medicare Other | Attending: Emergency Medicine | Admitting: Emergency Medicine

## 2015-12-07 ENCOUNTER — Encounter: Payer: Self-pay | Admitting: Emergency Medicine

## 2015-12-07 DIAGNOSIS — M549 Dorsalgia, unspecified: Secondary | ICD-10-CM | POA: Diagnosis present

## 2015-12-07 DIAGNOSIS — F17200 Nicotine dependence, unspecified, uncomplicated: Secondary | ICD-10-CM | POA: Insufficient documentation

## 2015-12-07 DIAGNOSIS — M5410 Radiculopathy, site unspecified: Secondary | ICD-10-CM | POA: Insufficient documentation

## 2015-12-07 DIAGNOSIS — G8929 Other chronic pain: Secondary | ICD-10-CM | POA: Insufficient documentation

## 2015-12-07 DIAGNOSIS — R202 Paresthesia of skin: Secondary | ICD-10-CM | POA: Insufficient documentation

## 2015-12-07 DIAGNOSIS — M542 Cervicalgia: Secondary | ICD-10-CM | POA: Diagnosis not present

## 2015-12-07 HISTORY — DX: Cramp and spasm: R25.2

## 2015-12-07 HISTORY — DX: Pure hypercholesterolemia, unspecified: E78.00

## 2015-12-07 HISTORY — DX: Dorsalgia, unspecified: M54.9

## 2015-12-07 HISTORY — DX: Chronic pain syndrome: G89.4

## 2015-12-07 HISTORY — DX: Unspecified ovarian cyst, unspecified side: N83.209

## 2015-12-07 HISTORY — DX: Anesthesia of skin: R20.0

## 2015-12-07 LAB — URINALYSIS COMPLETE WITH MICROSCOPIC (ARMC ONLY)
BILIRUBIN URINE: NEGATIVE
GLUCOSE, UA: NEGATIVE mg/dL
HGB URINE DIPSTICK: NEGATIVE
Ketones, ur: NEGATIVE mg/dL
LEUKOCYTES UA: NEGATIVE
NITRITE: NEGATIVE
PH: 5 (ref 5.0–8.0)
Protein, ur: NEGATIVE mg/dL
SPECIFIC GRAVITY, URINE: 1.005 (ref 1.005–1.030)

## 2015-12-07 NOTE — Discharge Instructions (Signed)
Return to the emergency room if you have any new or worrisome symptoms including incontinence of bowel or bladder, progressive weakness over your baseline, or if you feel worse in any significant way.
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
## 2015-12-07 NOTE — ED Notes (Signed)
Bladder scan 144 ml

## 2015-12-08 ENCOUNTER — Ambulatory Visit
Admission: RE | Admit: 2015-12-08 | Discharge: 2015-12-08 | Disposition: A | Discharge status: Home / Self Care | Payer: Medicare Other | Source: Ambulatory Visit | Attending: Anesthesiology | Admitting: Anesthesiology

## 2015-12-08 ENCOUNTER — Other Ambulatory Visit: Payer: Self-pay | Admitting: Anesthesiology

## 2015-12-08 DIAGNOSIS — M545 Low back pain: Secondary | ICD-10-CM | POA: Diagnosis not present

## 2015-12-08 DIAGNOSIS — M5410 Radiculopathy, site unspecified: Secondary | ICD-10-CM

## 2015-12-08 DIAGNOSIS — M5416 Radiculopathy, lumbar region: Secondary | ICD-10-CM | POA: Diagnosis not present

## 2015-12-08 DIAGNOSIS — R202 Paresthesia of skin: Secondary | ICD-10-CM | POA: Insufficient documentation

## 2015-12-08 DIAGNOSIS — R3915 Urgency of urination: Secondary | ICD-10-CM | POA: Insufficient documentation

## 2015-12-08 DIAGNOSIS — R200 Anesthesia of skin: Secondary | ICD-10-CM | POA: Diagnosis not present

## 2016-01-02 ENCOUNTER — Emergency Department: Payer: Medicare Other

## 2016-01-02 ENCOUNTER — Encounter: Payer: Self-pay | Admitting: *Deleted

## 2016-01-02 ENCOUNTER — Emergency Department
Admission: EM | Admit: 2016-01-02 | Discharge: 2016-01-02 | Discharge status: Against Medical Advice | Payer: Medicare Other | Attending: Emergency Medicine | Admitting: Emergency Medicine

## 2016-01-02 DIAGNOSIS — F17200 Nicotine dependence, unspecified, uncomplicated: Secondary | ICD-10-CM | POA: Diagnosis not present

## 2016-01-02 DIAGNOSIS — R202 Paresthesia of skin: Secondary | ICD-10-CM | POA: Insufficient documentation

## 2016-01-02 DIAGNOSIS — E876 Hypokalemia: Secondary | ICD-10-CM | POA: Diagnosis not present

## 2016-01-02 DIAGNOSIS — G8929 Other chronic pain: Secondary | ICD-10-CM | POA: Insufficient documentation

## 2016-01-02 DIAGNOSIS — M545 Low back pain: Secondary | ICD-10-CM | POA: Insufficient documentation

## 2016-01-02 DIAGNOSIS — M79606 Pain in leg, unspecified: Secondary | ICD-10-CM | POA: Diagnosis not present

## 2016-01-02 DIAGNOSIS — Z9189 Other specified personal risk factors, not elsewhere classified: Secondary | ICD-10-CM

## 2016-01-02 LAB — BASIC METABOLIC PANEL
ANION GAP: 10 (ref 5–15)
BUN: 8 mg/dL (ref 6–20)
CHLORIDE: 103 mmol/L (ref 101–111)
CO2: 28 mmol/L (ref 22–32)
Calcium: 9.2 mg/dL (ref 8.9–10.3)
Creatinine, Ser: 0.72 mg/dL (ref 0.44–1.00)
Glucose, Bld: 139 mg/dL — ABNORMAL HIGH (ref 65–99)
POTASSIUM: 2.7 mmol/L — AB (ref 3.5–5.1)
SODIUM: 141 mmol/L (ref 135–145)

## 2016-01-02 LAB — CBC
HEMATOCRIT: 41.5 % (ref 35.0–47.0)
Hemoglobin: 13.9 g/dL (ref 12.0–16.0)
MCH: 28.7 pg (ref 26.0–34.0)
MCHC: 33.5 g/dL (ref 32.0–36.0)
MCV: 85.7 fL (ref 80.0–100.0)
Platelets: 302 10*3/uL (ref 150–440)
RBC: 4.84 MIL/uL (ref 3.80–5.20)
RDW: 14.5 % (ref 11.5–14.5)
WBC: 12.6 10*3/uL — AB (ref 3.6–11.0)

## 2016-01-02 LAB — URINALYSIS COMPLETE WITH MICROSCOPIC (ARMC ONLY)
Bilirubin Urine: NEGATIVE
Glucose, UA: NEGATIVE mg/dL
KETONES UR: NEGATIVE mg/dL
LEUKOCYTES UA: NEGATIVE
NITRITE: NEGATIVE
PH: 5 (ref 5.0–8.0)
PROTEIN: NEGATIVE mg/dL
Specific Gravity, Urine: 1.009 (ref 1.005–1.030)

## 2016-01-02 MED ORDER — POTASSIUM CHLORIDE 10 MEQ/100ML IV SOLN
10.0000 meq | Freq: Once | INTRAVENOUS | Status: DC
  Filled 2016-01-02: qty 100

## 2016-01-02 MED ORDER — POTASSIUM CHLORIDE CRYS ER 20 MEQ PO TBCR: 40.0000 meq | EXTENDED_RELEASE_TABLET | Freq: Once | ORAL | Status: DC

## 2016-01-02 NOTE — ED Notes (Signed)
Pt in via triage; reports getting into bathtub Friday night and not waking up and getting out until Saturday, states legs were swollen and "puss was coming out of them."  Pt states, "I had to crawl out of the bathtub.  Pt reports passing out shortly after, "I fell forward and hit the carpet."  Pt w/ complaints of pain to back and legs.  Pt A/Ox4, no immediate distress at this time.

## 2016-01-02 NOTE — ED Notes (Signed)
Pt states she fell asleep in her bathtub on Friday and did not get out till Saturday, states when she woke up in the bathtub she felt dizzy and passed out, states she hit her face and is now complaining of back pain and leg pain, states she feels like her legs are frozen

## 2016-01-02 NOTE — ED Notes (Signed)
Per Dr. Burlene Arnt, attempted to make contact with patient without success to inform her she should see her PCP about her potassium and obtain prescriptions for same.

## 2016-01-02 NOTE — ED Notes (Addendum)
Notified by tech that xray was looking for pt but the pt has left the room.  It appears that pt has eloped.  MD notified.

## 2016-01-02 NOTE — ED Provider Notes (Signed)
[facility] Emergency Department Provider Note  ____________________________________________   I have reviewed the triage vital signs and the nursing notes.   HISTORY  Chief Complaint Back Pain and Leg Pain    HPI Megan Roth is a 50 y.o. female who presents today with multiple different complaints. Patient is chronic pain and has been here for chronic pain in the past. We last saw her several weeks ago. Patient has had MRIs and back surgeries and has chronic paresthesias. Patient states that she does not ago call but for some reason she fell sleep in the bathtub on Friday, this is Tuesday. She spent the night in the bathtub and then on Saturday morning she got up and went as she was walking immediately after night and that time she fell and bumped her nose. She did not pass out. She states her chronic back pain is worse and she spent the night in the bathtub. She denies any change in her chronic neurologic symptoms. She states her nose hurts where she hit it on the ground as she fell but most of the burden of the fall was on her arm. She states she has been ambulatory at baseline using her walker since she fell which is normal for her. She is here because she has increased pain in her back from this fall 3 days ago as well as pain in her nose from where she fell after spending the night in the bathtub. She denies any nausea or vomiting. It is hard to tell why she came in today. She states that she is here because of her nose hurts and her back hurts.  Past Medical History  Diagnosis Date  . Back pain   . Hypercholesteremia   . Numbness in feet   . Pain syndrome, chronic   . Spasticity   . Ovarian cyst     There are no active problems to display for this patient.   Past Surgical History  Procedure Laterality Date  . Laminectomy    . Cholecystectomy    . Abdominal hysterectomy    . Mandible fracture surgery      No current outpatient  prescriptions on file.  Allergies Abilify; Ambien; Latuda; Lurasidone; Neurontin; Sulfa antibiotics; and Wellbutrin  History reviewed. No pertinent family history.  Social History Social History  Substance Use Topics  . Smoking status: Current Every Day Smoker  . Smokeless tobacco: None  . Alcohol Use: No    Review of Systems Constitutional: No fever/chills Eyes: No visual changes. ENT: No sore throat. No stiff neck no neck pain Cardiovascular: Denies chest pain. Respiratory: Denies shortness of breath. Gastrointestinal:   no vomiting.  No diarrhea.  No constipation. Genitourinary: Negative for dysuria. Musculoskeletal: Negative lower extremity swelling Skin: Negative for rash. Neurological: Negative for headaches, focal weakness or numbness. 10-point ROS otherwise negative.  ____________________________________________   PHYSICAL EXAM:  VITAL SIGNS: ED Triage Vitals  Enc Vitals Group     BP 01/02/16 1448 141/72 mmHg     Pulse Rate 01/02/16 1448 83     Resp 01/02/16 1448 17     Temp 01/02/16 1448 98.2 F (36.8 C)     Temp Source 01/02/16 1448 Oral     SpO2 01/02/16 1448 98 %     Weight 01/02/16 1448 140 lb (63.504 kg)     Height 01/02/16 1448 5\' 3"  (1.6 m)     Head Cir --      Peak Flow --  Pain Score 01/02/16 1451 10     Pain Loc --      Pain Edu? --      Excl. in [place]? --     Constitutional: Alert and oriented. Well appearing and in no acute distress. Eyes: Conjunctivae are normal. PERRL. EOMI. Head: Atraumatic. Nose: No congestion/rhinnorhea. No obvious swelling or septal hematoma noted Mouth/Throat: Mucous membranes are moist.  Oropharynx non-erythematous. Neck: No stridor.   Nontender with no meningismus Cardiovascular: Normal rate, regular rhythm. Grossly normal heart sounds.  Good peripheral circulation. Respiratory: Normal respiratory effort.  No retractions. Lungs CTAB. Abdominal: Soft and nontender. No distention. No guarding no rebound Back:   Is mild paraspinal tenderness of his very distractible and the lumbar region, consistent with prior exam is no midline tenderness there are no lesions noted. there is no CVA tenderness Musculoskeletal: No lower extremity tenderness. No joint effusions, no DVT signs strong distal pulses no edema Neurologic:  Normal speech and language. No gross focal neurologic deficits are appreciated. No saddle anesthesia declines rectal exam Skin:  Skin is warm, dry and intact. No rash noted. Psychiatric: Mood and affect are normal. Speech and behavior are normal.  ____________________________________________   LABS (all labs ordered are listed, but only abnormal results are displayed)  Labs Reviewed  BASIC METABOLIC PANEL - Abnormal; Notable for the following:    Potassium 2.7 (*)    Glucose, Bld 139 (*)    All other components within normal limits  CBC - Abnormal; Notable for the following:    WBC 12.6 (*)    All other components within normal limits  URINALYSIS COMPLETEWITH MICROSCOPIC ([facility] ONLY) - Abnormal; Notable for the following:    Color, Urine YELLOW (*)    APPearance CLEAR (*)    Hgb urine dipstick 1+ (*)    Bacteria, UA RARE (*)    Squamous Epithelial / LPF 0-5 (*)    All other components within normal limits   ____________________________________________  EKG  I personally interpreted any EKGs ordered by me or triage  ____________________________________________  RADIOLOGY  I reviewed any imaging ordered by me or triage that were performed during my shift ____________________________________________   PROCEDURES  Procedure(s) performed: None  Critical Care performed: None  ____________________________________________   INITIAL IMPRESSION / ASSESSMENT AND PLAN / ED COURSE  Pertinent labs & imaging results that were available during my care of the patient were reviewed by me and considered in my medical decision making (see chart for details).  Patient  incidentally noted to have hypokalemia, we were ordering to replace it, also I was going to x-ray there is present her body that hurt after her fall however, patient elected to elope from the emergency department. She left her gown and departed without talking to anyone. I did let her know that her potassium was low and that we're going to replete it and she elected to leave anyway. I don't see any acute medical pathology on her today, ____________________________________________   FINAL CLINICAL IMPRESSION(S) / ED DIAGNOSES  Final diagnoses:  None      This chart was dictated using voice recognition software.  Despite best efforts to proofread,  errors can occur which can change meaning.      Schuyler Amor, MD 01/02/16 [phone]

## 2016-02-13 ENCOUNTER — Encounter: Payer: Self-pay | Admitting: *Deleted

## 2016-02-13 ENCOUNTER — Inpatient Hospital Stay: Payer: Medicare Other

## 2016-02-13 ENCOUNTER — Inpatient Hospital Stay: Payer: Medicare Other | Attending: Internal Medicine | Admitting: Internal Medicine

## 2016-02-13 VITALS — BP 152/90 | HR 68 | Temp 97.7°F | Resp 18 | Ht 63.0 in | Wt 139.1 lb

## 2016-02-13 DIAGNOSIS — Z87442 Personal history of urinary calculi: Secondary | ICD-10-CM

## 2016-02-13 DIAGNOSIS — F419 Anxiety disorder, unspecified: Secondary | ICD-10-CM

## 2016-02-13 DIAGNOSIS — M549 Dorsalgia, unspecified: Secondary | ICD-10-CM

## 2016-02-13 DIAGNOSIS — K219 Gastro-esophageal reflux disease without esophagitis: Secondary | ICD-10-CM | POA: Diagnosis not present

## 2016-02-13 DIAGNOSIS — Z79899 Other long term (current) drug therapy: Secondary | ICD-10-CM | POA: Diagnosis not present

## 2016-02-13 DIAGNOSIS — D72829 Elevated white blood cell count, unspecified: Secondary | ICD-10-CM | POA: Diagnosis not present

## 2016-02-13 DIAGNOSIS — E7800 Pure hypercholesterolemia, unspecified: Secondary | ICD-10-CM

## 2016-02-13 DIAGNOSIS — G8929 Other chronic pain: Secondary | ICD-10-CM | POA: Diagnosis not present

## 2016-02-13 DIAGNOSIS — D509 Iron deficiency anemia, unspecified: Secondary | ICD-10-CM

## 2016-02-13 DIAGNOSIS — F17210 Nicotine dependence, cigarettes, uncomplicated: Secondary | ICD-10-CM

## 2016-02-13 DIAGNOSIS — N951 Menopausal and female climacteric states: Secondary | ICD-10-CM | POA: Insufficient documentation

## 2016-02-13 DIAGNOSIS — K5909 Other constipation: Secondary | ICD-10-CM | POA: Diagnosis not present

## 2016-02-13 LAB — CBC WITH DIFFERENTIAL/PLATELET
BASOS ABS: 0.2 10*3/uL — AB (ref 0–0.1)
Basophils Relative: 1 %
EOS ABS: 0.3 10*3/uL (ref 0–0.7)
EOS PCT: 2 %
HCT: 41.4 % (ref 35.0–47.0)
HEMOGLOBIN: 13.9 g/dL (ref 12.0–16.0)
Lymphocytes Relative: 47 %
Lymphs Abs: 6.1 10*3/uL — ABNORMAL HIGH (ref 1.0–3.6)
MCH: 29 pg (ref 26.0–34.0)
MCHC: 33.6 g/dL (ref 32.0–36.0)
MCV: 86.2 fL (ref 80.0–100.0)
Monocytes Absolute: 1 10*3/uL — ABNORMAL HIGH (ref 0.2–0.9)
Monocytes Relative: 7 %
Neutro Abs: 5.7 10*3/uL (ref 1.4–6.5)
Neutrophils Relative %: 43 %
Platelets: 323 10*3/uL (ref 150–440)
RBC: 4.8 MIL/uL (ref 3.80–5.20)
RDW: 14.2 % (ref 11.5–14.5)
WBC: 13.3 10*3/uL — AB (ref 3.6–11.0)

## 2016-02-13 LAB — LACTATE DEHYDROGENASE: LDH: 124 U/L (ref 98–192)

## 2016-02-13 NOTE — Progress Notes (Signed)
[place] NOTE  Patient Care Team: Katheren Shams as PCP - General  CHIEF COMPLAINTS/PURPOSE OF CONSULTATION:    # 2013- MILD LEUCOCYTOSIS- predom neutrophilia  # Smoker  HISTORY OF PRESENTING ILLNESS:  Megan Roth 50 y.o.  female  Long-standing history of smoking has been referred to [place] for further evaluation of leukocytosis.   Patient has been very anxious in the last 10 days regarding her appointment at the [facility].  Otherwise denies any weight loss.  She has had hot flashes/  Which have improved on Premarin.    Patient denies any frequent infections denies any  Use of steroids.  Patient is chronic back pain/  Had multiple surgeries in the past. This is not any worse.  No skin rash.  Otherwise no nausea no vomiting no significant weight loss.    ROS: A complete 10 point review of system is done which is negative except mentioned above in history of present illness  MEDICAL HISTORY:  Past Medical History  Diagnosis Date  . Back pain   . Hypercholesteremia   . Numbness in feet   . Pain syndrome, chronic   . Spasticity   . Ovarian cyst   . Generalized anxiety disorder   . Tension headache   . Fatigue   . Depression   . Gallbladder calculus   . GERD (gastroesophageal reflux disease)   . Kidney stone   . Tobacco abuse   . Seasonal allergies   . Chronic constipation   . IDA (iron deficiency anemia)   . Degenerative disc disease, lumbar   . Scoliosis     SURGICAL HISTORY: Past Surgical History  Procedure Laterality Date  . Laminectomy      x 3 surgeries  . Cholecystectomy    . Abdominal hysterectomy      TAHBSO  . Mandible fracture surgery    . Laparoscopic ovarian cystectomy      SOCIAL HISTORY: Social History   Social History  . Marital Status: Single    Spouse Name: N/A  . Number of Children: N/A  . Years of Education: N/A   Occupational History  . Not on file.   Social History Main Topics  .  Smoking status: Current Every Day Smoker -- 1.00 packs/day for 30 years    Types: Cigarettes  . Smokeless tobacco: Never Used  . Alcohol Use: No  . Drug Use: No  . Sexual Activity: Not on file   Other Topics Concern  . Not on file   Social History Narrative    FAMILY HISTORY: Family History  Problem Relation Age of Onset  . Arthritis    . Diabetes Mellitus II    . Heart disease    . Hypertension    . Kidney disease    . Stroke      ALLERGIES:  is allergic to latuda; shellfish allergy; abilify; ambien; lurasidone; neurontin; sulfa antibiotics; and wellbutrin.  MEDICATIONS:  Current Outpatient Prescriptions  Medication Sig Dispense Refill  . cycloSPORINE (RESTASIS) 0.05 % ophthalmic emulsion 1 drop 2 (two) times daily.    . diazepam (VALIUM) 10 MG tablet Take 10 mg by mouth 3 (three) times daily.    Marland Kitchen estrogens, conjugated, (PREMARIN) 0.625 MG tablet Take 0.625 mg by mouth 2 (two) times daily. Take daily for 21 days then do not take for 7 days.    . meloxicam (MOBIC) 15 MG tablet Take 15 mg by mouth daily.    . mirtazapine (REMERON) 15 MG tablet  Take 15 mg by mouth at bedtime as needed.    Marland Kitchen morphine (KADIAN) 60 MG 24 hr capsule Take 60 mg by mouth 2 (two) times daily.    . Multiple Vitamins-Minerals (ALIVE ONCE DAILY WOMENS 50+ PO) Take 1 tablet by mouth daily.    . pantoprazole (PROTONIX) 40 MG tablet Take 40 mg by mouth daily.    . polyethylene glycol (MIRALAX / GLYCOLAX) packet Take 17 g by mouth daily.    Marland Kitchen senna (SENOKOT) 8.6 MG tablet Take 2 tablets by mouth daily.    Marland Kitchen tiZANidine (ZANAFLEX) 4 MG capsule Take 4 mg by mouth 3 (three) times daily.     No current facility-administered medications for this visit.      Marland Kitchen  PHYSICAL EXAMINATION:   Filed Vitals:   02/13/16 1201  BP: 152/90  Pulse: 68  Temp: 97.7 F (36.5 C)  Resp: 18   Filed Weights   02/13/16 1201  Weight: 139 lb 1.8 oz (63.1 kg)    GENERAL: Well-nourished well-developed; Alert, no  distress and comfortable.   Alone. EYES: no pallor or icterus OROPHARYNX: no thrush or ulceration; NECK: supple, no masses felt LYMPH:  no palpable lymphadenopathy in the cervical, axillary or inguinal regions LUNGS: clear to auscultation and  No wheeze or crackles HEART/CVS: regular rate & rhythm and no murmurs; No lower extremity edema ABDOMEN: abdomen soft, non-tender and normal bowel sounds Musculoskeletal:no cyanosis of digits and no clubbing  PSYCH: alert & oriented x 3 with fluent speech NEURO: no focal motor/sensory deficits SKIN:  no rashes or significant lesions  LABORATORY DATA:  I have reviewed the data as listed Lab Results  Component Value Date   WBC 12.6* 01/02/2016   HGB 13.9 01/02/2016   HCT 41.5 01/02/2016   MCV 85.7 01/02/2016   PLT 302 01/02/2016    Recent Labs  01/02/16 1454  NA 141  K 2.7*  CL 103  CO2 28  GLUCOSE 139*  BUN 8  CREATININE 0.72  CALCIUM 9.2  GFRNONAA >60  GFRAA >60     ASSESSMENT & PLAN:   # LEUCOCYTOSIS-mild predominant neutrophilia- Chronic mild 11-13 [atleast since 2013] Likely secondary to smoking/ depression/ medications. However we'll rule out any malignant causes- check CBC with review of peripheral smear today; Check peripheral blood flow cytometry/ BCR ABL  # Smoking-  Discussed regarding smoking cessation.  # Patient follow-up with me in approximately 2 weeks to review the results of the her workup.  Thank you Dr.Hande  for allowing me to participate in the care of your pleasant patient. Please do not hesitate to contact me with questions or concerns in the interim.  # 30 minutes face-to-face with the patient discussing the above plan of care; more than 50% of time spent on counseling and coordination.       Cammie Sickle, MD 02/13/2016 12:14 PM

## 2016-02-13 NOTE — Progress Notes (Signed)
Pt here and very anxious because of her being told that she needs to come to the cancer center.  She has lot of pain in back and has had 3 surgeries for DDD. She was told a long time ago that she had low wbc and she should stay away of sick people so she won't catch something..  She has hot flashes and has been on premarin and it is much better.  She has not had any infections lately.

## 2016-02-19 LAB — COMP PANEL: LEUKEMIA/LYMPHOMA

## 2016-02-19 LAB — BCR-ABL1 FISH
Cells Analyzed: 200
Cells Counted: 200

## 2016-02-27 ENCOUNTER — Inpatient Hospital Stay (HOSPITAL_BASED_OUTPATIENT_CLINIC_OR_DEPARTMENT_OTHER): Payer: Medicare Other | Admitting: Internal Medicine

## 2016-02-27 VITALS — BP 151/87 | HR 71 | Temp 97.0°F | Resp 18 | Wt 142.2 lb

## 2016-02-27 DIAGNOSIS — K219 Gastro-esophageal reflux disease without esophagitis: Secondary | ICD-10-CM

## 2016-02-27 DIAGNOSIS — D72829 Elevated white blood cell count, unspecified: Secondary | ICD-10-CM | POA: Diagnosis not present

## 2016-02-27 DIAGNOSIS — G8929 Other chronic pain: Secondary | ICD-10-CM

## 2016-02-27 DIAGNOSIS — F419 Anxiety disorder, unspecified: Secondary | ICD-10-CM

## 2016-02-27 DIAGNOSIS — K5909 Other constipation: Secondary | ICD-10-CM | POA: Diagnosis not present

## 2016-02-27 DIAGNOSIS — F17210 Nicotine dependence, cigarettes, uncomplicated: Secondary | ICD-10-CM | POA: Diagnosis not present

## 2016-02-27 DIAGNOSIS — D509 Iron deficiency anemia, unspecified: Secondary | ICD-10-CM

## 2016-02-27 DIAGNOSIS — Z79899 Other long term (current) drug therapy: Secondary | ICD-10-CM

## 2016-02-27 DIAGNOSIS — Z87442 Personal history of urinary calculi: Secondary | ICD-10-CM

## 2016-02-27 DIAGNOSIS — N951 Menopausal and female climacteric states: Secondary | ICD-10-CM

## 2016-02-27 DIAGNOSIS — M549 Dorsalgia, unspecified: Secondary | ICD-10-CM

## 2016-02-27 DIAGNOSIS — E7800 Pure hypercholesterolemia, unspecified: Secondary | ICD-10-CM

## 2016-02-27 NOTE — Progress Notes (Signed)
[place] NOTE  Patient Care Team: Katheren Shams as PCP - General  CHIEF COMPLAINTS/PURPOSE OF CONSULTATION:    # 2013- MILD LEUCOCYTOSIS-likely sec to smoking predom neutrophilia; bcr-abl-NEG/Flowcytometry- NEG  # Smoker  HISTORY OF PRESENTING ILLNESS:  Megan Roth 50 y.o.  female  Long-standing history of smoking is here to review the blood work for her leukocytosis.  She continues very anxious; continues to smoke. She has chronic back pain which is not any worse. Otherwise no new shortness of breath or cough or chest pain.  Otherwise denies any weight loss.  She has had hot flashes/  Which have improved on Premarin.  Otherwise no nausea no vomiting no significant weight loss.   ROS: A complete 10 point review of system is done which is negative except mentioned above in history of present illness  MEDICAL HISTORY:  Past Medical History  Diagnosis Date  . Back pain   . Hypercholesteremia   . Numbness in feet   . Pain syndrome, chronic   . Spasticity   . Ovarian cyst   . Generalized anxiety disorder   . Tension headache   . Fatigue   . Depression   . Gallbladder calculus   . GERD (gastroesophageal reflux disease)   . Kidney stone   . Tobacco abuse   . Seasonal allergies   . Chronic constipation   . IDA (iron deficiency anemia)   . Degenerative disc disease, lumbar   . Scoliosis     SURGICAL HISTORY: Past Surgical History  Procedure Laterality Date  . Laminectomy      x 3 surgeries  . Cholecystectomy    . Abdominal hysterectomy      TAHBSO  . Mandible fracture surgery    . Laparoscopic ovarian cystectomy      SOCIAL HISTORY: Social History   Social History  . Marital Status: Single    Spouse Name: N/A  . Number of Children: N/A  . Years of Education: N/A   Occupational History  . Not on file.   Social History Main Topics  . Smoking status: Current Every Day Smoker -- 1.00 packs/day for 30 years    Types:  Cigarettes  . Smokeless tobacco: Never Used  . Alcohol Use: No  . Drug Use: No  . Sexual Activity: Not on file   Other Topics Concern  . Not on file   Social History Narrative    FAMILY HISTORY: Family History  Problem Relation Age of Onset  . Arthritis    . Diabetes Mellitus II    . Heart disease    . Hypertension    . Kidney disease    . Stroke      ALLERGIES:  is allergic to latuda; shellfish allergy; abilify; ambien; lurasidone; neurontin; sulfa antibiotics; and wellbutrin.  MEDICATIONS:  Current Outpatient Prescriptions  Medication Sig Dispense Refill  . cycloSPORINE (RESTASIS) 0.05 % ophthalmic emulsion 1 drop 2 (two) times daily.    . diazepam (VALIUM) 10 MG tablet Take 10 mg by mouth 3 (three) times daily.    Marland Kitchen estrogens, conjugated, (PREMARIN) 0.625 MG tablet Take 0.625 mg by mouth 2 (two) times daily. Take daily for 21 days then do not take for 7 days.    Marland Kitchen estrogens, conjugated, (PREMARIN) 0.625 MG tablet Take by mouth.    . fluticasone (FLONASE) 50 MCG/ACT nasal spray Place into the nose.    . meloxicam (MOBIC) 15 MG tablet Take 15 mg by mouth daily.    Marland Kitchen  meloxicam (MOBIC) 15 MG tablet Take by mouth.    . mirtazapine (REMERON) 15 MG tablet Take 15 mg by mouth at bedtime as needed.    Marland Kitchen morphine (KADIAN) 60 MG 24 hr capsule Take 60 mg by mouth 2 (two) times daily.    Marland Kitchen morphine (KADIAN) 60 MG 24 hr capsule Take by mouth.    . Multiple Vitamins-Minerals (ALIVE ONCE DAILY WOMENS 50+ PO) Take 1 tablet by mouth daily.    . nicotine (NICOTROL) 10 MG inhaler     . pantoprazole (PROTONIX) 40 MG tablet Take 40 mg by mouth daily.    . polyethylene glycol (MIRALAX / GLYCOLAX) packet Take 17 g by mouth daily.    Marland Kitchen senna (SENOKOT) 8.6 MG tablet Take 2 tablets by mouth daily.    Marland Kitchen senna (SENOKOT) 8.6 MG tablet     . tiZANidine (ZANAFLEX) 4 MG capsule Take 4 mg by mouth 3 (three) times daily.    . mirtazapine (REMERON) 30 MG tablet     . tiZANidine (ZANAFLEX) 4 MG tablet       No current facility-administered medications for this visit.      Marland Kitchen  PHYSICAL EXAMINATION:   Filed Vitals:   02/27/16 1140  BP: 151/87  Pulse: 71  Temp: 97 F (36.1 C)  Resp: 18   Filed Weights   02/27/16 1140  Weight: 142 lb 3.2 oz (64.5 kg)    GENERAL: Well-nourished well-developed; Alert, no distress and comfortable.   Alone.  LABORATORY DATA:  I have reviewed the data as listed Lab Results  Component Value Date   WBC 13.3* 02/13/2016   HGB 13.9 02/13/2016   HCT 41.4 02/13/2016   MCV 86.2 02/13/2016   PLT 323 02/13/2016    Recent Labs  01/02/16 1454  NA 141  K 2.7*  CL 103  CO2 28  GLUCOSE 139*  BUN 8  CREATININE 0.72  CALCIUM 9.2  GFRNONAA >60  GFRAA >60     ASSESSMENT & PLAN:   # LEUCOCYTOSIS-mild predominant neutrophilia- Chronic mild 11-13 [atleast since 2013] Likely secondary to smoking/ depression/ medications.BCR ABL negative- ruling out CML;  flow cytometry negative for any acute process. Clinically again I doubt patient has any malignant process contributing to the mild leukocytosis- as she is fairly asymptomatic/no new symptoms; and  her leukocytosis has been chronic.  # Smoking- again recommended quitting smoking.  # Patient will continue follow-up with her PCP. No  follow-up is made in the [facility].  # # 15 minutes face-to-face with the patient discussing the above plan of care; more than 50% of time spent on natural history; counseling and coordination.     Cammie Sickle, MD 02/27/2016 11:49 AM

## 2016-02-27 NOTE — Progress Notes (Signed)
Patient here today for labwork.

## 2016-06-14 ENCOUNTER — Other Ambulatory Visit: Payer: Self-pay | Admitting: Internal Medicine

## 2016-06-14 DIAGNOSIS — M5440 Lumbago with sciatica, unspecified side: Secondary | ICD-10-CM

## 2016-06-27 ENCOUNTER — Ambulatory Visit
Admission: RE | Admit: 2016-06-27 | Discharge: 2016-06-27 | Disposition: A | Discharge status: Home / Self Care | Payer: Medicare Other | Source: Ambulatory Visit | Attending: Internal Medicine | Admitting: Internal Medicine

## 2016-06-27 DIAGNOSIS — M5440 Lumbago with sciatica, unspecified side: Secondary | ICD-10-CM | POA: Diagnosis not present

## 2016-06-27 DIAGNOSIS — Z9889 Other specified postprocedural states: Secondary | ICD-10-CM | POA: Diagnosis not present

## 2016-06-27 MED ORDER — GADOBENATE DIMEGLUMINE 529 MG/ML IV SOLN
15.0000 mL | Freq: Once | INTRAVENOUS | Status: AC | PRN
  Administered 2016-06-27: 13 mL via INTRAVENOUS

## 2017-03-07 ENCOUNTER — Other Ambulatory Visit: Payer: Self-pay | Admitting: Internal Medicine

## 2017-03-07 DIAGNOSIS — R519 Headache, unspecified: Secondary | ICD-10-CM

## 2017-03-07 DIAGNOSIS — F3289 Other specified depressive episodes: Secondary | ICD-10-CM

## 2017-03-07 DIAGNOSIS — R51 Headache: Principal | ICD-10-CM

## 2017-04-07 ENCOUNTER — Ambulatory Visit: Admission: RE | Admit: 2017-04-07 | Payer: Medicare Other | Source: Ambulatory Visit

## 2017-04-19 ENCOUNTER — Ambulatory Visit
Admission: RE | Admit: 2017-04-19 | Discharge: 2017-04-19 | Disposition: A | Discharge status: Home / Self Care | Payer: Medicare Other | Source: Ambulatory Visit | Attending: Internal Medicine | Admitting: Internal Medicine

## 2017-04-19 DIAGNOSIS — R51 Headache: Secondary | ICD-10-CM | POA: Insufficient documentation

## 2017-04-19 DIAGNOSIS — R519 Headache, unspecified: Secondary | ICD-10-CM

## 2017-04-19 DIAGNOSIS — F3289 Other specified depressive episodes: Secondary | ICD-10-CM | POA: Insufficient documentation

## 2017-05-16 ENCOUNTER — Emergency Department: Payer: Medicare Other

## 2017-05-16 ENCOUNTER — Encounter: Payer: Self-pay | Admitting: Medical Oncology

## 2017-05-16 ENCOUNTER — Other Ambulatory Visit: Payer: Self-pay | Admitting: Internal Medicine

## 2017-05-16 ENCOUNTER — Emergency Department
Admission: EM | Admit: 2017-05-16 | Discharge: 2017-05-16 | Disposition: A | Discharge status: Home / Self Care | Payer: Medicare Other | Attending: Emergency Medicine | Admitting: Emergency Medicine

## 2017-05-16 DIAGNOSIS — R109 Unspecified abdominal pain: Secondary | ICD-10-CM

## 2017-05-16 DIAGNOSIS — R1032 Left lower quadrant pain: Secondary | ICD-10-CM | POA: Diagnosis not present

## 2017-05-16 DIAGNOSIS — N6310 Unspecified lump in the right breast, unspecified quadrant: Secondary | ICD-10-CM

## 2017-05-16 DIAGNOSIS — Z79899 Other long term (current) drug therapy: Secondary | ICD-10-CM | POA: Diagnosis not present

## 2017-05-16 DIAGNOSIS — F17210 Nicotine dependence, cigarettes, uncomplicated: Secondary | ICD-10-CM | POA: Insufficient documentation

## 2017-05-16 DIAGNOSIS — Z791 Long term (current) use of non-steroidal anti-inflammatories (NSAID): Secondary | ICD-10-CM | POA: Insufficient documentation

## 2017-05-16 LAB — URINALYSIS, COMPLETE (UACMP) WITH MICROSCOPIC
Bilirubin Urine: NEGATIVE
GLUCOSE, UA: NEGATIVE mg/dL
HGB URINE DIPSTICK: NEGATIVE
KETONES UR: NEGATIVE mg/dL
Leukocytes, UA: NEGATIVE
NITRITE: NEGATIVE
PROTEIN: NEGATIVE mg/dL
Specific Gravity, Urine: 1.011 (ref 1.005–1.030)
pH: 5 (ref 5.0–8.0)

## 2017-05-16 LAB — CBC
HEMATOCRIT: 42.2 % (ref 35.0–47.0)
HEMOGLOBIN: 14.2 g/dL (ref 12.0–16.0)
MCH: 29.5 pg (ref 26.0–34.0)
MCHC: 33.7 g/dL (ref 32.0–36.0)
MCV: 87.4 fL (ref 80.0–100.0)
Platelets: 287 10*3/uL (ref 150–440)
RBC: 4.83 MIL/uL (ref 3.80–5.20)
RDW: 13.7 % (ref 11.5–14.5)
WBC: 10.1 10*3/uL (ref 3.6–11.0)

## 2017-05-16 LAB — COMPREHENSIVE METABOLIC PANEL
ALBUMIN: 4 g/dL (ref 3.5–5.0)
ALK PHOS: 87 U/L (ref 38–126)
ALT: 13 U/L — ABNORMAL LOW (ref 14–54)
ANION GAP: 9 (ref 5–15)
AST: 18 U/L (ref 15–41)
BUN: 6 mg/dL (ref 6–20)
CALCIUM: 9.4 mg/dL (ref 8.9–10.3)
CO2: 26 mmol/L (ref 22–32)
Chloride: 107 mmol/L (ref 101–111)
Creatinine, Ser: 0.53 mg/dL (ref 0.44–1.00)
GFR calc non Af Amer: >60 mL/min (ref >60)
GLUCOSE: 100 mg/dL — AB (ref 65–99)
POTASSIUM: 3.2 mmol/L — AB (ref 3.5–5.1)
SODIUM: 142 mmol/L (ref 135–145)
TOTAL PROTEIN: 7.2 g/dL (ref 6.5–8.1)
Total Bilirubin: 0.5 mg/dL (ref 0.3–1.2)

## 2017-05-16 LAB — LIPASE, BLOOD: Lipase: 30 U/L (ref 11–51)

## 2017-05-16 NOTE — ED Provider Notes (Addendum)
[facility] Emergency Department Provider Note   ____________________________________________    I have reviewed the triage vital signs and the nursing notes.   HISTORY  Chief Complaint Abdominal Pain     HPI Megan Roth is a 51 y.o. female who presents with complaints of abdominal pain. Patient reports for the last 3 days she has had constant pain in her left lower quadrant which she says started abruptly approximately 30 minutes after taking a Movantik pill for constipation. Patient has chronic back pain and takes opioids for this. She denies fevers or chills. No dysuria. She does have a history of kidney stones. No nausea or vomiting. Denies a history of diverticulitis. She reports the pain is severe and stabbing in nature   Past Medical History:  Diagnosis Date  . Back pain   . Chronic constipation   . Degenerative disc disease, lumbar   . Depression   . Fatigue   . Gallbladder calculus   . Generalized anxiety disorder   . GERD (gastroesophageal reflux disease)   . Hypercholesteremia   . IDA (iron deficiency anemia)   . Kidney stone   . Numbness in feet   . Ovarian cyst   . Pain syndrome, chronic   . Scoliosis   . Seasonal allergies   . Spasticity   . Tension headache   . Tobacco abuse     There are no active problems to display for this patient.   Past Surgical History:  Procedure Laterality Date  . ABDOMINAL HYSTERECTOMY     TAHBSO  . CHOLECYSTECTOMY    . LAMINECTOMY     x 3 surgeries  . LAPAROSCOPIC OVARIAN CYSTECTOMY    . MANDIBLE FRACTURE SURGERY      Prior to Admission medications   Medication Sig Start Date End Date Taking? Authorizing Provider  cycloSPORINE (RESTASIS) 0.05 % ophthalmic emulsion 1 drop 2 (two) times daily.    [provider]  diazepam (VALIUM) 10 MG tablet Take 10 mg by mouth 3 (three) times daily.    [provider]  estrogens, conjugated, (PREMARIN) 0.625 MG tablet  Take 0.625 mg by mouth 2 (two) times daily. Take daily for 21 days then do not take for 7 days.    [provider]  estrogens, conjugated, (PREMARIN) 0.625 MG tablet Take by mouth. 02/13/16   [provider]  fluticasone (FLONASE) 50 MCG/ACT nasal spray Place into the nose. 10/25/14   [provider]  meloxicam (MOBIC) 15 MG tablet Take 15 mg by mouth daily.    [provider]  mirtazapine (REMERON) 15 MG tablet Take 15 mg by mouth at bedtime as needed.    [provider]  mirtazapine (REMERON) 30 MG tablet  02/19/16   [provider]  morphine (KADIAN) 60 MG 24 hr capsule Take 60 mg by mouth 2 (two) times daily.    [provider]  Multiple Vitamins-Minerals (ALIVE ONCE DAILY WOMENS 50+ PO) Take 1 tablet by mouth daily.    [provider]  nicotine (NICOTROL) 10 MG inhaler  11/30/14   [provider]  pantoprazole (PROTONIX) 40 MG tablet Take 40 mg by mouth daily.    [provider]  polyethylene glycol (MIRALAX / GLYCOLAX) packet Take 17 g by mouth daily.    [provider]  senna (SENOKOT) 8.6 MG tablet Take 2 tablets by mouth daily.    [provider]  senna (SENOKOT) 8.6 MG tablet  01/10/16   [provider]  tiZANidine (ZANAFLEX) 4 MG capsule Take 4 mg by mouth 3 (three) times daily.    [provider]  tiZANidine (ZANAFLEX) 4 MG tablet  02/13/16   [provider]     Allergies Gadolinium derivatives; Latuda [lurasidone hcl]; Shellfish allergy; Abilify [aripiprazole]; Ambien [zolpidem]; Lurasidone; Neurontin [gabapentin]; Sulfa antibiotics; and Wellbutrin [bupropion]  Family History  Problem Relation Age of Onset  . Arthritis Unknown   . Diabetes Mellitus II Unknown   . Heart disease Unknown   . Hypertension Unknown   . Kidney disease Unknown   . Stroke Unknown     Social History Social History  Substance Use Topics  . Smoking status: Current Every  Day Smoker    Packs/day: 1.00    Years: 30.00    Types: Cigarettes  . Smokeless tobacco: Never Used  . Alcohol use No    Review of Systems  Constitutional: No fever/chills Eyes: No visual changes.  ENT: No sore throat. Cardiovascular: Denies chest pain. Respiratory: Denies shortness of breath. Gastrointestinal: As above Genitourinary: Negative for dysuria. Musculoskeletal: Negative for back pain. Skin: Negative for rash. Neurological: Negative for headaches or weakness   ____________________________________________   PHYSICAL EXAM:  VITAL SIGNS: ED Triage Vitals [05/16/17 1009]  Enc Vitals Group     BP (!) 156/86     Pulse Rate 71     Resp 18     Temp 98.1 F (36.7 C)     Temp Source Oral     SpO2 100 %     Weight 63.5 kg (140 lb)     Height 1.6 m (5\' 3" )     Head Circumference      Peak Flow      Pain Score 8     Pain Loc      Pain Edu?      Excl. in [place]?     Constitutional: Alert and oriented. No acute distress. Pleasant and interactive Eyes: Conjunctivae are normal.  Head: Atraumatic. Nose: No congestion/rhinnorhea. Mouth/Throat: Mucous membranes are moist.   Neck:  Painless ROM Cardiovascular: Normal rate, regular rhythm. Grossly normal heart sounds.  Good peripheral circulation. Respiratory: Normal respiratory effort.  No retractions. Lungs CTAB. Gastrointestinal: Moderate tenderness to palpation left lower quadrant. No distention.  No CVA tenderness. Genitourinary: deferred Musculoskeletal: No lower extremity tenderness nor edema.  Warm and well perfused Neurologic:  Normal speech and language. No gross focal neurologic deficits are appreciated.  Skin:  Skin is warm, dry and intact.  Psychiatric: Mood and affect are normal. Speech and behavior are normal.  ____________________________________________   LABS (all labs ordered are listed, but only abnormal results are displayed)  Labs Reviewed  COMPREHENSIVE METABOLIC PANEL - Abnormal; Notable  for the following:       Result Value   Potassium 3.2 (*)    Glucose, Bld 100 (*)    ALT 13 (*)    All other components within normal limits  URINALYSIS, COMPLETE (UACMP) WITH MICROSCOPIC - Abnormal; Notable for the following:    Color, Urine AMBER (*)    APPearance CLOUDY (*)    Bacteria, UA RARE (*)    Squamous Epithelial / LPF 0-5 (*)    All other components within normal limits  LIPASE, BLOOD  CBC   ____________________________________________  EKG  ED ECG REPORT I, Lavonia Drafts, the attending physician, personally viewed and interpreted this ECG.  Date: 05/28/2017  Rate: 68 Rhythm: normal sinus rhythm QRS Axis: normal Intervals: normal ST/T Wave abnormalities: Nonspecific changes  Narrative Interpretation: unremarkable  ____________________________________________  RADIOLOGY  CT renal stone study pending ____________________________________________   PROCEDURES  Procedure(s) performed: No    Critical Care performed: No ____________________________________________   INITIAL IMPRESSION / ASSESSMENT AND PLAN / ED COURSE  Pertinent labs & imaging results that were available during my care of the patient were reviewed by me and considered in my medical decision making (see chart for details).  Patient presents with left lower quadrant abdominal pain. Differential includes ureterolithiasis, diverticulitis, constipation. Lab work is reassuring, we will obtain CT to evaluate further, patient has IV dye allergy   CT scan is reassuring, patient is feeling better without intervention. She agrees with discharge, she has pain medication she can take at home. She will return if any worsening of her symptoms.    ____________________________________________   FINAL CLINICAL IMPRESSION(S) / ED DIAGNOSES  Final diagnoses:  Acute left flank pain  Left lower quadrant pain      NEW MEDICATIONS STARTED DURING THIS VISIT:  New Prescriptions   No medications  on file     Note:  This document was prepared using Dragon voice recognition software and may include unintentional dictation errors.    Lavonia Drafts, MD 05/16/17 1352    Lavonia Drafts, MD 05/28/17 1325

## 2017-05-16 NOTE — ED Notes (Signed)
MD at bedside. 

## 2017-05-16 NOTE — ED Triage Notes (Signed)
Pt reports that she has chronic constipation d/t chronic opioid use. Pt was started on new medication called movantik for constipation, pt states that she began the med Tuesday and since about 30 min after taking the med she has been having left upper abd pain. Pt reports that she has had her bowels to move since then. Denies other sx's.

## 2017-06-16 ENCOUNTER — Ambulatory Visit
Admit: 2017-06-16 | Discharge: 2017-06-16 | Disposition: A | Discharge status: Home / Self Care | Payer: Medicare Other | Attending: Internal Medicine | Admitting: Internal Medicine

## 2017-06-16 DIAGNOSIS — N6310 Unspecified lump in the right breast, unspecified quadrant: Secondary | ICD-10-CM | POA: Insufficient documentation

## 2017-06-19 ENCOUNTER — Other Ambulatory Visit: Payer: Self-pay | Admitting: Internal Medicine

## 2017-06-19 DIAGNOSIS — R928 Other abnormal and inconclusive findings on diagnostic imaging of breast: Secondary | ICD-10-CM

## 2017-06-19 DIAGNOSIS — R921 Mammographic calcification found on diagnostic imaging of breast: Secondary | ICD-10-CM

## 2017-07-17 ENCOUNTER — Other Ambulatory Visit: Payer: Self-pay | Admitting: Surgery

## 2017-07-24 ENCOUNTER — Ambulatory Visit
Admission: RE | Admit: 2017-07-24 | Discharge: 2017-07-24 | Disposition: A | Discharge status: Home / Self Care | Payer: Medicare Other | Source: Ambulatory Visit | Attending: Internal Medicine | Admitting: Internal Medicine

## 2017-07-24 DIAGNOSIS — R921 Mammographic calcification found on diagnostic imaging of breast: Secondary | ICD-10-CM

## 2017-07-24 DIAGNOSIS — R928 Other abnormal and inconclusive findings on diagnostic imaging of breast: Secondary | ICD-10-CM

## 2017-07-24 HISTORY — PX: BREAST BIOPSY: SHX20

## 2017-07-28 LAB — SURGICAL PATHOLOGY

## 2017-07-29 ENCOUNTER — Encounter: Admission: RE | Payer: Self-pay | Source: Ambulatory Visit

## 2017-07-29 ENCOUNTER — Ambulatory Visit: Admission: RE | Admit: 2017-07-29 | Payer: Medicare Other | Source: Ambulatory Visit | Admitting: Gastroenterology

## 2017-07-29 SURGERY — COLONOSCOPY WITH PROPOFOL
Anesthesia: General

## 2017-08-05 ENCOUNTER — Other Ambulatory Visit: Payer: Self-pay | Admitting: Physician Assistant

## 2017-08-05 DIAGNOSIS — M5417 Radiculopathy, lumbosacral region: Secondary | ICD-10-CM

## 2017-08-14 ENCOUNTER — Ambulatory Visit
Admission: RE | Admit: 2017-08-14 | Discharge: 2017-08-14 | Disposition: A | Discharge status: Home / Self Care | Payer: Medicare Other | Source: Ambulatory Visit | Attending: Physician Assistant | Admitting: Physician Assistant

## 2017-08-14 DIAGNOSIS — M5417 Radiculopathy, lumbosacral region: Secondary | ICD-10-CM | POA: Diagnosis present

## 2017-08-14 DIAGNOSIS — M5126 Other intervertebral disc displacement, lumbar region: Secondary | ICD-10-CM | POA: Insufficient documentation

## 2017-08-14 DIAGNOSIS — Z981 Arthrodesis status: Secondary | ICD-10-CM | POA: Insufficient documentation

## 2017-12-11 ENCOUNTER — Other Ambulatory Visit (HOSPITAL_COMMUNITY): Payer: Self-pay | Admitting: Physician Assistant

## 2017-12-11 DIAGNOSIS — R200 Anesthesia of skin: Secondary | ICD-10-CM

## 2017-12-24 ENCOUNTER — Ambulatory Visit
Admission: RE | Admit: 2017-12-24 | Discharge: 2017-12-24 | Disposition: A | Discharge status: Home / Self Care | Payer: Medicare Other | Source: Ambulatory Visit | Attending: Physician Assistant | Admitting: Physician Assistant

## 2017-12-24 DIAGNOSIS — R200 Anesthesia of skin: Secondary | ICD-10-CM

## 2017-12-24 DIAGNOSIS — M4802 Spinal stenosis, cervical region: Secondary | ICD-10-CM | POA: Insufficient documentation

## 2017-12-24 DIAGNOSIS — M542 Cervicalgia: Secondary | ICD-10-CM | POA: Diagnosis present

## 2018-01-02 ENCOUNTER — Encounter: Payer: Self-pay | Admitting: *Deleted

## 2018-01-05 ENCOUNTER — Ambulatory Visit: Payer: Medicare Other | Admitting: Anesthesiology

## 2018-01-05 ENCOUNTER — Ambulatory Visit
Admission: RE | Admit: 2018-01-05 | Discharge: 2018-01-05 | Disposition: A | Discharge status: Home / Self Care | Payer: Medicare Other | Source: Ambulatory Visit | Attending: Gastroenterology | Admitting: Gastroenterology

## 2018-01-05 ENCOUNTER — Encounter
Admission: RE | Disposition: A | Discharge status: Home / Self Care | Payer: Self-pay | Source: Ambulatory Visit | Attending: Gastroenterology

## 2018-01-05 ENCOUNTER — Other Ambulatory Visit: Payer: Self-pay

## 2018-01-05 DIAGNOSIS — K210 Gastro-esophageal reflux disease with esophagitis: Secondary | ICD-10-CM | POA: Diagnosis not present

## 2018-01-05 DIAGNOSIS — F17200 Nicotine dependence, unspecified, uncomplicated: Secondary | ICD-10-CM | POA: Diagnosis not present

## 2018-01-05 DIAGNOSIS — Z882 Allergy status to sulfonamides status: Secondary | ICD-10-CM | POA: Insufficient documentation

## 2018-01-05 DIAGNOSIS — D124 Benign neoplasm of descending colon: Secondary | ICD-10-CM | POA: Diagnosis not present

## 2018-01-05 DIAGNOSIS — F329 Major depressive disorder, single episode, unspecified: Secondary | ICD-10-CM | POA: Diagnosis not present

## 2018-01-05 DIAGNOSIS — Z87442 Personal history of urinary calculi: Secondary | ICD-10-CM | POA: Insufficient documentation

## 2018-01-05 DIAGNOSIS — R195 Other fecal abnormalities: Secondary | ICD-10-CM | POA: Insufficient documentation

## 2018-01-05 DIAGNOSIS — Z91013 Allergy to seafood: Secondary | ICD-10-CM | POA: Insufficient documentation

## 2018-01-05 DIAGNOSIS — M1990 Unspecified osteoarthritis, unspecified site: Secondary | ICD-10-CM | POA: Insufficient documentation

## 2018-01-05 DIAGNOSIS — K3189 Other diseases of stomach and duodenum: Secondary | ICD-10-CM | POA: Insufficient documentation

## 2018-01-05 DIAGNOSIS — E7800 Pure hypercholesterolemia, unspecified: Secondary | ICD-10-CM | POA: Insufficient documentation

## 2018-01-05 DIAGNOSIS — M419 Scoliosis, unspecified: Secondary | ICD-10-CM | POA: Insufficient documentation

## 2018-01-05 DIAGNOSIS — K449 Diaphragmatic hernia without obstruction or gangrene: Secondary | ICD-10-CM | POA: Insufficient documentation

## 2018-01-05 DIAGNOSIS — Z538 Procedure and treatment not carried out for other reasons: Secondary | ICD-10-CM | POA: Insufficient documentation

## 2018-01-05 DIAGNOSIS — Z888 Allergy status to other drugs, medicaments and biological substances status: Secondary | ICD-10-CM | POA: Diagnosis not present

## 2018-01-05 DIAGNOSIS — F411 Generalized anxiety disorder: Secondary | ICD-10-CM | POA: Insufficient documentation

## 2018-01-05 DIAGNOSIS — Z79899 Other long term (current) drug therapy: Secondary | ICD-10-CM | POA: Insufficient documentation

## 2018-01-05 HISTORY — DX: Migraine, unspecified, not intractable, without status migrainosus: G43.909

## 2018-01-05 HISTORY — PX: COLONOSCOPY WITH PROPOFOL: SHX5780

## 2018-01-05 HISTORY — PX: ESOPHAGOGASTRODUODENOSCOPY (EGD) WITH PROPOFOL: SHX5813

## 2018-01-05 HISTORY — DX: Personal history of urinary calculi: Z87.442

## 2018-01-05 SURGERY — ESOPHAGOGASTRODUODENOSCOPY (EGD) WITH PROPOFOL
Anesthesia: General

## 2018-01-05 MED ORDER — SODIUM CHLORIDE 0.9 % IV SOLN
INTRAVENOUS | Status: DC
  Administered 2018-01-05: 15:00:00 via INTRAVENOUS

## 2018-01-05 MED ORDER — PHENYLEPHRINE HCL 10 MG/ML IJ SOLN
INTRAMUSCULAR | Status: DC | PRN
  Administered 2018-01-05 (×7): 100 ug via INTRAVENOUS

## 2018-01-05 MED ORDER — PROPOFOL 10 MG/ML IV BOLUS
INTRAVENOUS | Status: DC | PRN
  Administered 2018-01-05: 80 mg via INTRAVENOUS

## 2018-01-05 MED ORDER — PROPOFOL 500 MG/50ML IV EMUL
INTRAVENOUS | Status: DC | PRN
  Administered 2018-01-05: 150 ug/kg/min via INTRAVENOUS

## 2018-01-05 MED ORDER — SODIUM CHLORIDE 0.9 % IV SOLN: INTRAVENOUS | Status: DC

## 2018-01-05 NOTE — Anesthesia Procedure Notes (Signed)
Date/Time: 01/05/2018 3:44 PM Performed by: Nelda Marseille, CRNA Pre-anesthesia Checklist: Patient identified, Emergency Drugs available, Suction available, Patient being monitored and Timeout performed Oxygen Delivery Method: Nasal cannula

## 2018-01-05 NOTE — Anesthesia Preprocedure Evaluation (Signed)
Anesthesia Evaluation  Patient identified by MRN, date of birth, ID band Patient awake    Reviewed: Allergy & Precautions, NPO status , Patient's Chart, lab work & pertinent test results  History of Anesthesia Complications Negative for: history of anesthetic complications  Airway Mallampati: I  TM Distance: >3 FB Neck ROM: Full    Dental  (+) Poor Dentition   Pulmonary neg sleep apnea, neg COPD, Current Smoker,    breath sounds clear to auscultation- rhonchi (-) wheezing      Cardiovascular Exercise Tolerance: Good (-) hypertension(-) CAD, (-) Past MI, (-) Cardiac Stents and (-) CABG  Rhythm:Regular Rate:Normal - Systolic murmurs and - Diastolic murmurs    Neuro/Psych  Headaches, PSYCHIATRIC DISORDERS Anxiety Depression    GI/Hepatic Neg liver ROS, GERD  ,  Endo/Other  negative endocrine ROSneg diabetes  Renal/GU negative Renal ROS     Musculoskeletal  (+) Arthritis ,   Abdominal (+) - obese,   Peds  Hematology  (+) anemia ,   Anesthesia Other Findings Past Medical History: No date: Back pain No date: Chronic constipation No date: Degenerative disc disease, lumbar No date: Depression No date: Fatigue No date: Gallbladder calculus No date: Generalized anxiety disorder No date: GERD (gastroesophageal reflux disease) No date: History of kidney stones No date: Hypercholesteremia No date: IDA (iron deficiency anemia) No date: Migraine No date: Numbness in feet No date: Ovarian cyst No date: Pain syndrome, chronic No date: Scoliosis No date: Seasonal allergies No date: Spasticity No date: Tension headache No date: Tobacco abuse   Reproductive/Obstetrics                             Anesthesia Physical Anesthesia Plan  ASA: II  Anesthesia Plan: General   Post-op Pain Management:    Induction: Intravenous  PONV Risk Score and Plan: 1 and Propofol infusion  Airway  Management Planned: Natural Airway  Additional Equipment:   Intra-op Plan:   Post-operative Plan:   Informed Consent: I have reviewed the patients History and Physical, chart, labs and discussed the procedure including the risks, benefits and alternatives for the proposed anesthesia with the patient or authorized representative who has indicated his/her understanding and acceptance.   Dental advisory given  Plan Discussed with: CRNA and Anesthesiologist  Anesthesia Plan Comments:         Anesthesia Quick Evaluation

## 2018-01-05 NOTE — Op Note (Signed)
[facility] Gastroenterology Patient Name: Megan Roth Procedure Date: 01/05/2018 3:25 PM MRN: [id number] Account #: [id number] Date of Birth: 24-Jan-1966 Admit Type: Outpatient Age: 52 Room: [facility] ENDO ROOM 1 Gender: Female Note Status: Finalized Procedure:            Colonoscopy Indications:          Heme positive stool Providers:            Lollie Sails, MD Referring MD:         Tracie Harrier, MD (Referring MD) Medicines:            Monitored Anesthesia Care Complications:        No immediate complications. Procedure:            Pre-Anesthesia Assessment:                       - ASA Grade Assessment: II - A patient with mild                        systemic disease.                       After obtaining informed consent, the colonoscope was                        passed under direct vision. Throughout the procedure,                        the patient's blood pressure, pulse, and oxygen                        saturations were monitored continuously. The                        Colonoscope was introduced through the anus with the                        intention of advancing to the cecum. The scope was                        advanced to the descending colon before the procedure                        was aborted. Medications were given. The colonoscopy                        was unusually difficult due to poor bowel prep with                        stool present. Findings:      A 4 mm polyp was found in the descending colon. The polyp was sessile.       The polyp was removed with a piecemeal technique using a cold biopsy       forceps. Resection and retrieval were complete.      The digital rectal exam was normal.      A large amount of semi-solid stool was found in the sigmoid colon and in       the descending colon, precluding visualization. Impression:           - One 4 mm polyp in the descending  colon, removed                        piecemeal  using a cold biopsy forceps. Resected and                        retrieved.                       - Stool in the sigmoid colon and in the descending                        colon. Recommendation:       - Discharge patient to home.                       - Reschedule and repeat prep. Procedure Code(s):    --- Professional ---                       [id number], 68, Colonoscopy, flexible; with biopsy, single                        or multiple Diagnosis Code(s):    --- Professional ---                       D12.4, Benign neoplasm of descending colon                       R19.5, Other fecal abnormalities CPT copyright 2016 American [employer] Association. All rights reserved. The codes documented in this report are preliminary and upon coder review may  be revised to meet current compliance requirements. Lollie Sails, MD 01/05/2018 4:28:36 PM This report has been signed electronically. Number of Addenda: 0 Note Initiated On: 01/05/2018 3:25 PM Total Procedure Duration: 0 hours 13 minutes 6 seconds       [facility]

## 2018-01-05 NOTE — H&P (Signed)
Outpatient short stay form Pre-procedure 01/05/2018 3:35 PM Megan Sails MD  Primary Physician: Dr Tracie Harrier  Reason for visit: EGD and colonoscopy  History of present illness: Patient is a 52 year old female presenting today as above.  She has a history of gastroesophageal reflux has had her gallbladder removed.  She has reflux that burns as well as taste better.  She gets epigastric bloating.  She does take narcotics on a regular basis.  She takes NSAIDs on a regular basis.  She is not currently on a proton pump inhibitor although she has been on some in the past.  She does take some ranitidine currently.  He does have symptoms that awaken her at night.  She takes no aspirin or blood thinning agent.  Tolerated prep well.    Current Facility-Administered Medications:  .  0.9 %  sodium chloride infusion, , Intravenous, Continuous, Megan Sails, MD, Last Rate: 20 mL/hr at 01/05/18 1435 .  0.9 %  sodium chloride infusion, , Intravenous, Continuous, Megan Sails, MD  Medications Prior to Admission  Medication Sig Dispense Refill Last Dose  . cycloSPORINE (RESTASIS) 0.05 % ophthalmic emulsion 1 drop 2 (two) times daily.   01/04/2018 at Unknown time  . diazepam (VALIUM) 10 MG tablet Take 10 mg by mouth 3 (three) times daily.   01/05/2018 at 0900  . estrogens, conjugated, (PREMARIN) 0.625 MG tablet Take 0.625 mg by mouth daily. Take daily for 21 days then do not take for 7 days.    Past Week at Unknown time  . morphine (KADIAN) 60 MG 24 hr capsule Take 60 mg by mouth 2 (two) times daily.   01/04/2018 at 2100  . naloxegol oxalate (MOVANTIK) 12.5 MG TABS tablet Take 12.5 mg by mouth daily.   Past Week at Unknown time  . polyethylene glycol (MIRALAX / GLYCOLAX) packet Take 17 g by mouth daily.   01/04/2018 at Unknown time  . tiZANidine (ZANAFLEX) 4 MG capsule Take 4 mg by mouth 3 (three) times daily.   01/04/2018 at 2100  . calcium-vitamin D (OSCAL WITH D) 500-200 MG-UNIT tablet Take 1  tablet by mouth 2 (two) times daily.   Not Taking at Unknown time  . estrogens, conjugated, (PREMARIN) 0.625 MG tablet Take by mouth.     . fluticasone (FLONASE) 50 MCG/ACT nasal spray Place into the nose.   Not Taking at Unknown time  . meloxicam (MOBIC) 15 MG tablet Take 15 mg by mouth daily.   01/02/2018  . mirtazapine (REMERON) 15 MG tablet Take 15 mg by mouth at bedtime as needed.   Not Taking at Unknown time  . mirtazapine (REMERON) 30 MG tablet    Not Taking at Unknown time  . Multiple Vitamins-Minerals (ALIVE ONCE DAILY WOMENS 50+ PO) Take 1 tablet by mouth daily.   01/02/2018 at 0900  . nicotine (NICOTROL) 10 MG inhaler    Not Taking at Unknown time  . pantoprazole (PROTONIX) 40 MG tablet Take 40 mg by mouth daily.   Not Taking at Unknown time  . ranitidine (ZANTAC) 300 MG capsule Take 300 mg by mouth every evening.   Not Taking at Unknown time  . senna (SENOKOT) 8.6 MG tablet Take 2 tablets by mouth daily.   01/02/2018  . senna (SENOKOT) 8.6 MG tablet      . tiZANidine (ZANAFLEX) 4 MG tablet         Allergies  Allergen Reactions  . Gadolinium Derivatives Nausea And Vomiting and Hypertension  Pt gets nauseated and vomited with MRI contrast Multihance as well as hypertensive with headache; per Radiologist Dr. [place], only give contrast in future if life or death situation.   Megan Roth [Lurasidone Hcl] Shortness Of Breath  . Shellfish Allergy Shortness Of Breath and Swelling  . Abilify [Aripiprazole] Other (See Comments)    Leg movements Involuntary muscle movements of legs    . Ambien [Zolpidem] Other (See Comments)    Involuntary muscle movement  . Lurasidone   . Neurontin [Gabapentin] Other (See Comments)    Redness to neck, face and chest  . Sulfa Antibiotics Swelling and Other (See Comments)    Rapid heart beat  . Wellbutrin [Bupropion] Nausea And Vomiting, Swelling and Rash     Past Medical History:  Diagnosis Date  . Back pain   . Chronic constipation   .  Degenerative disc disease, lumbar   . Depression   . Fatigue   . Gallbladder calculus   . Generalized anxiety disorder   . GERD (gastroesophageal reflux disease)   . History of kidney stones   . Hypercholesteremia   . IDA (iron deficiency anemia)   . Migraine   . Numbness in feet   . Ovarian cyst   . Pain syndrome, chronic   . Scoliosis   . Seasonal allergies   . Spasticity   . Tension headache   . Tobacco abuse     Review of systems:      Physical Exam    Heart and lungs:.    HEENT: Normocephalic atraumatic eyes are anicteric    Other:    Pertinant exam for procedure: Soft mild tender to palpation in the right lower quadrant.  Bowel sounds are positive and normoactive there are no masses or rebound.    Planned proceedures: EGD, colonoscopy and indicated procedures. I have discussed the risks benefits and complications of procedures to include not limited to bleeding, infection, perforation and the risk of sedation and the patient wishes to proceed.    Megan Sails, MD Gastroenterology 01/05/2018  3:35 PM

## 2018-01-05 NOTE — Op Note (Addendum)
[facility] Gastroenterology Patient Name: Megan Roth Procedure Date: 01/05/2018 3:27 PM MRN: [id number] Account #: [id number] Date of Birth: 1966/03/02 Admit Type: Outpatient Age: 53 Room: [facility] ENDO ROOM 1 Gender: Female Note Status: Finalized Procedure:            Upper GI endoscopy Indications:          Dyspepsia, Heme positive stool Providers:            Lollie Sails, MD Referring MD:         Tracie Harrier, MD (Referring MD) Medicines:            Monitored Anesthesia Care Procedure:            Pre-Anesthesia Assessment:                       - ASA Grade Assessment: II - A patient with mild                        systemic disease.                       After obtaining informed consent, the endoscope was                        passed under direct vision. Throughout the procedure,                        the patient's blood pressure, pulse, and oxygen                        saturations were monitored continuously. The Endoscope                        was introduced through the mouth, and advanced to the                        third part of duodenum. The upper GI endoscopy was                        accomplished without difficulty. The patient tolerated                        the procedure well. Findings:      LA Grade B (one or more mucosal breaks greater than 5 mm, not extending       between the tops of two mucosal folds) esophagitis with no bleeding was       found. Biopsies were taken with a cold forceps for histology.      The exam of the esophagus was otherwise normal.      Diffuse mild inflammation characterized by congestion (edema) and       erythema was found in the gastric body. Biopsies were taken with a cold       forceps for histology.      A single 1 mm angiodysplastic lesion without bleeding was found in the       duodenal bulb. This appears deep to the lining. No others were seen in       the duodenum or stomach.      A small  hiatal hernia was present.      The cardia and gastric fundus were normal on  retroflexion. Impression:           - LA Grade B erosive esophagitis. Biopsied.                       - Bile gastritis. Biopsied.                       - A single non-bleeding angiodysplastic lesion in the                        duodenum.                       - Small hiatal hernia. Recommendation:       - Use Protonix (pantoprazole) 40 mg PO BID daily.                       - Use sucralfate tablets 1 gram PO QID.                       - Do a gastric emptying study at appointment to be                        scheduled. Procedure Code(s):    --- Professional ---                       [phone], Esophagogastroduodenoscopy, flexible, transoral;                        with biopsy, single or multiple Diagnosis Code(s):    --- Professional ---                       K20.8, Other esophagitis                       K29.60, Other gastritis without bleeding                       K31.819, Angiodysplasia of stomach and duodenum without                        bleeding                       K44.9, Diaphragmatic hernia without obstruction or                        gangrene                       R10.13, Epigastric pain                       R19.5, Other fecal abnormalities CPT copyright 2016 American Medical Association. All rights reserved. The codes documented in this report are preliminary and upon coder review may  be revised to meet current compliance requirements. Lollie Sails, MD 01/05/2018 4:06:00 PM This report has been signed electronically. Number of Addenda: 0 Note Initiated On: 01/05/2018 3:27 PM      [facility]

## 2018-01-05 NOTE — Anesthesia Postprocedure Evaluation (Signed)
Anesthesia Post Note  Patient: Thelma Lorenzetti  Procedure(s) Performed: ESOPHAGOGASTRODUODENOSCOPY (EGD) WITH PROPOFOL (N/A ) COLONOSCOPY WITH PROPOFOL (N/A )  Patient location during evaluation: Endoscopy Anesthesia Type: General Level of consciousness: awake and alert Pain management: pain level controlled Vital Signs Assessment: post-procedure vital signs reviewed and stable Respiratory status: spontaneous breathing, nonlabored ventilation, respiratory function stable and patient connected to nasal cannula oxygen Cardiovascular status: blood pressure returned to baseline and stable Postop Assessment: no apparent nausea or vomiting Anesthetic complications: no     Last Vitals:  Vitals:   01/05/18 1405 01/05/18 1626  BP: 124/82   Pulse: 83   Resp: 18   Temp: (!) 36.1 C (!) 35.9 C  SpO2: 100%     Last Pain:  Vitals:   01/05/18 1626  TempSrc: Tympanic  PainSc:                  Mariano Doshi S

## 2018-01-05 NOTE — Transfer of Care (Signed)
Immediate Anesthesia Transfer of Care Note  Patient: Megan Roth  Procedure(s) Performed: ESOPHAGOGASTRODUODENOSCOPY (EGD) WITH PROPOFOL (N/A ) COLONOSCOPY WITH PROPOFOL (N/A )  Patient Location: PACU  Anesthesia Type:General  Level of Consciousness: awake, alert  and oriented  Airway & Oxygen Therapy: Patient Spontanous Breathing and Patient connected to nasal cannula oxygen  Post-op Assessment: Report given to RN and Post -op Vital signs reviewed and stable  Post vital signs: Reviewed and stable  Last Vitals:  Vitals:   01/05/18 1405 01/05/18 1626  BP: 124/82   Pulse: 83   Resp: 18   Temp: (!) 36.1 C (!) 35.9 C  SpO2: 100%     Last Pain:  Vitals:   01/05/18 1626  TempSrc: Tympanic  PainSc:          Complications: No apparent anesthesia complications

## 2018-01-05 NOTE — Anesthesia Post-op Follow-up Note (Signed)
Anesthesia QCDR form completed.        

## 2018-01-05 NOTE — Brief Op Note (Signed)
Procedure aborted at descending colon due to poor prep

## 2018-01-06 ENCOUNTER — Encounter: Payer: Self-pay | Admitting: Gastroenterology

## 2018-01-06 ENCOUNTER — Other Ambulatory Visit: Payer: Self-pay | Admitting: Gastroenterology

## 2018-01-06 DIAGNOSIS — R1013 Epigastric pain: Secondary | ICD-10-CM

## 2018-01-07 LAB — SURGICAL PATHOLOGY

## 2018-01-17 ENCOUNTER — Encounter
Admission: RE | Admit: 2018-01-17 | Discharge: 2018-01-17 | Disposition: A | Discharge status: Home / Self Care | Payer: Medicare Other | Source: Ambulatory Visit | Attending: Gastroenterology | Admitting: Gastroenterology

## 2018-01-17 DIAGNOSIS — R1013 Epigastric pain: Secondary | ICD-10-CM | POA: Insufficient documentation

## 2018-01-17 MED ORDER — TECHNETIUM TC 99M SULFUR COLLOID
1.0800 | Freq: Once | INTRAVENOUS | Status: AC | PRN
  Administered 2018-01-17: 1.08 via ORAL

## 2018-02-09 ENCOUNTER — Ambulatory Visit
Payer: Medicare Other | Attending: Student in an Organized Health Care Education/Training Program | Admitting: Student in an Organized Health Care Education/Training Program

## 2018-02-09 ENCOUNTER — Encounter: Payer: Self-pay | Admitting: Student in an Organized Health Care Education/Training Program

## 2018-02-09 VITALS — BP 129/87 | HR 82 | Temp 98.4°F | Resp 16 | Ht 63.0 in | Wt 140.0 lb

## 2018-02-09 DIAGNOSIS — Z79899 Other long term (current) drug therapy: Secondary | ICD-10-CM | POA: Insufficient documentation

## 2018-02-09 DIAGNOSIS — M5030 Other cervical disc degeneration, unspecified cervical region: Secondary | ICD-10-CM | POA: Insufficient documentation

## 2018-02-09 DIAGNOSIS — R262 Difficulty in walking, not elsewhere classified: Secondary | ICD-10-CM | POA: Diagnosis not present

## 2018-02-09 DIAGNOSIS — Z9889 Other specified postprocedural states: Secondary | ICD-10-CM | POA: Insufficient documentation

## 2018-02-09 DIAGNOSIS — M542 Cervicalgia: Secondary | ICD-10-CM | POA: Diagnosis not present

## 2018-02-09 DIAGNOSIS — M5416 Radiculopathy, lumbar region: Secondary | ICD-10-CM | POA: Diagnosis not present

## 2018-02-09 DIAGNOSIS — F419 Anxiety disorder, unspecified: Secondary | ICD-10-CM | POA: Diagnosis not present

## 2018-02-09 DIAGNOSIS — M5136 Other intervertebral disc degeneration, lumbar region: Secondary | ICD-10-CM | POA: Diagnosis not present

## 2018-02-09 DIAGNOSIS — F4310 Post-traumatic stress disorder, unspecified: Secondary | ICD-10-CM | POA: Diagnosis not present

## 2018-02-09 DIAGNOSIS — G8929 Other chronic pain: Secondary | ICD-10-CM | POA: Insufficient documentation

## 2018-02-09 DIAGNOSIS — M5417 Radiculopathy, lumbosacral region: Secondary | ICD-10-CM | POA: Diagnosis not present

## 2018-02-09 DIAGNOSIS — M5412 Radiculopathy, cervical region: Secondary | ICD-10-CM | POA: Diagnosis not present

## 2018-02-09 DIAGNOSIS — M961 Postlaminectomy syndrome, not elsewhere classified: Secondary | ICD-10-CM | POA: Diagnosis not present

## 2018-02-09 DIAGNOSIS — F17210 Nicotine dependence, cigarettes, uncomplicated: Secondary | ICD-10-CM | POA: Insufficient documentation

## 2018-02-09 DIAGNOSIS — M51369 Other intervertebral disc degeneration, lumbar region without mention of lumbar back pain or lower extremity pain: Secondary | ICD-10-CM | POA: Insufficient documentation

## 2018-02-09 DIAGNOSIS — M5010 Cervical disc disorder with radiculopathy, unspecified cervical region: Secondary | ICD-10-CM | POA: Insufficient documentation

## 2018-02-09 NOTE — Progress Notes (Signed)
Safety precautions to be maintained throughout the outpatient stay will include: orient to surroundings, keep bed in low position, maintain call bell within reach at all times, provide assistance with transfer out of bed and ambulation.  

## 2018-02-09 NOTE — Progress Notes (Signed)
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 controlling the flow of urine, or chronic kidney disease Hematological: Brusing easily Endocrine: No reported endocrine signs or symptoms such as high or low blood sugar, rapid heart rate due to high thyroid levels, obesity or weight gain due to slow thyroid or thyroid disease Rheumatologic: No reported rheumatological signs and symptoms such as fatigue, joint pain, tenderness, swelling, redness, heat, stiffness, decreased range of motion, with or without associated rash Musculoskeletal: Negative for myasthenia gravis, muscular dystrophy, multiple sclerosis or malignant hyperthermia Work History: Disabled  Allergies  Ms. Aldredge is allergic to gadolinium derivatives; latuda [lurasidone hcl]; shellfish allergy; abilify [aripiprazole]; ambien [zolpidem]; lurasidone; neurontin [gabapentin]; sulfa antibiotics; and wellbutrin [bupropion].  Laboratory  Chemistry  Inflammation Markers (CRP: Acute Phase) (ESR: Chronic Phase) No results found for: CRP, ESRSEDRATE, LATICACIDVEN                       Rheumatology Markers No results found for: RF, ANA, LABURIC, URICUR, LYMEIGGIGMAB, LYMEABIGMQN  Renal Function Markers Lab Results  Component Value Date   BUN 6 05/16/2017   CREATININE 0.53 05/16/2017   GFRAA >60 05/16/2017   GFRNONAA >60 05/16/2017                 Hepatic Function Markers Lab Results  Component Value Date   AST 18 05/16/2017   ALT 13 (L) 05/16/2017   ALBUMIN 4.0 05/16/2017   ALKPHOS 87 05/16/2017   LIPASE 30 05/16/2017                 Electrolytes Lab Results  Component Value Date   NA 142 05/16/2017   K 3.2 (L) 05/16/2017   CL 107 05/16/2017   CALCIUM 9.4 05/16/2017   MG 1.5 10/19/2008   PHOS 2.9 10/19/2008                        Neuropathy Markers No results found for: VITAMINB12, FOLATE, HGBA1C, HIV               Bone Pathology Markers No results found for: VD25OH, VD125OH2TOT, [id number], AX6553ZS8, 25OHVITD1, 25OHVITD2, 25OHVITD3, TESTOFREE, TESTOSTERONE                       Coagulation Parameters Lab Results  Component Value Date   INR 1.0 10/19/2008   LABPROT 13.7 10/19/2008   PLT 287 05/16/2017                 Cardiovascular Markers Lab Results  Component Value Date   CKTOTAL 45 01/06/2012   CKMB < 0.5 (L) 01/06/2012   TROPONINI < 0.02 01/06/2012   HGB 14.2 05/16/2017   HCT 42.2 05/16/2017                 CA Markers No results found for: CEA, CA125, LABCA2               Note: Lab results reviewed.  [place]  Drug: Ms. Courser  reports that she does not use drugs. Alcohol:  reports that she does not drink alcohol. Tobacco:  reports that she has been smoking cigarettes.  She has a 15.00 pack-year smoking history. she has never used smokeless tobacco. Medical:  has a past medical history of Back pain, Chronic constipation, Degenerative disc disease, lumbar, Depression,  Fatigue, Gallbladder calculus, Generalized anxiety disorder, GERD (gastroesophageal reflux disease), History of kidney stones, Hypercholesteremia, IDA (iron deficiency anemia), Migraine, Numbness in feet, Ovarian cyst, Pain syndrome, chronic, Scoliosis, Seasonal allergies, Spasticity, Tension headache, and Tobacco abuse. Family: family history includes Arthritis in her unknown relative; Diabetes Mellitus II in her unknown relative; Heart disease in her unknown relative; Hypertension in her unknown relative; Kidney disease in her unknown relative; Stroke in her unknown relative.  Past Surgical History:  Procedure Laterality Date  . ABDOMINAL HYSTERECTOMY     TAHBSO  . BREAST BIOPSY Left 07/24/2017   stereotactic biopsy  . CHOLECYSTECTOMY    . COLONOSCOPY WITH PROPOFOL N/A 01/05/2018   Procedure: COLONOSCOPY WITH PROPOFOL;  Surgeon: Lollie Sails, MD;  Location: [facility] ENDOSCOPY;  Service: Endoscopy;  Laterality: N/A;  . ESOPHAGOGASTRODUODENOSCOPY (EGD) WITH PROPOFOL N/A 01/05/2018   Procedure: ESOPHAGOGASTRODUODENOSCOPY (EGD) WITH PROPOFOL;  Surgeon: Lollie Sails, MD;  Location: [facility] ENDOSCOPY;  Service: Endoscopy;  Laterality: N/A;  . LAMINECTOMY     x 3 surgeries  . LAPAROSCOPIC OVARIAN CYSTECTOMY    . MANDIBLE FRACTURE SURGERY     Active Ambulatory Problems  Diagnosis Date Noted  . 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 History of lumbar surgery 02/09/2018  . Lumbar radiculopathy 02/09/2018  . Lumbar degenerative disc disease 02/09/2018  . DDD (degenerative disc disease), cervical 02/09/2018  . Cervical radiculopathy 02/09/2018  . Cervicalgia 02/09/2018   Resolved Ambulatory Problems    Diagnosis Date Noted  . No Resolved Ambulatory Problems   Past Medical History:  Diagnosis Date  . Back pain   . Chronic constipation   . Degenerative disc disease, lumbar   . Depression   . Fatigue   . Gallbladder calculus   . Generalized anxiety disorder   . GERD  (gastroesophageal reflux disease)   . History of kidney stones   . Hypercholesteremia   . IDA (iron deficiency anemia)   . Migraine   . Numbness in feet   . Ovarian cyst   . Pain syndrome, chronic   . Scoliosis   . Seasonal allergies   . Spasticity   . Tension headache   . Tobacco abuse    Constitutional Exam  General appearance: Well nourished, well developed, and well hydrated. In no apparent acute distress Vitals:   02/09/18 1212  BP: 129/87  Pulse: 82  Resp: 16  Temp: 98.4 F (36.9 C)  TempSrc: Oral  SpO2: 98%  Weight: 140 lb (63.5 kg)  Height: _0  (1.6 m)   BMI Assessment: Estimated body mass index is 24.8 kg/m as calculated from the following:   Height as of this encounter: _1  (1.6 m).   Weight as of this encounter: 140 lb (63.5 kg).  BMI interpretation table: BMI level Category Range association with higher incidence of chronic pain  <18 kg/m2 Underweight   18.5-24.9 kg/m2 Ideal body weight   25-29.9 kg/m2 Overweight Increased incidence by 20%  30-34.9 kg/m2 Obese (Class I) Increased incidence by 68%  35-39.9 kg/m2 Severe obesity (Class II) Increased incidence by 136%  >40 kg/m2 Extreme obesity (Class III) Increased incidence by 254%   BMI Readings from Last 4 Encounters:  02/09/18 24.80 kg/m  01/05/18 24.80 kg/m  05/16/17 24.80 kg/m  02/27/16 25.19 kg/m   Wt Readings from Last 4 Encounters:  02/09/18 140 lb (63.5 kg)  01/05/18 140 lb (63.5 kg)  05/16/17 140 lb (63.5 kg)  02/27/16 142 lb 3.2 oz (64.5 kg)  Psych/Mental status: Alert, oriented x 3 (person, place, & time)       Eyes: PERLA Respiratory: No evidence of acute respiratory distress  Cervical Spine Area Exam  Skin & Axial Inspection: Well healed scar from previous spine surgery detected Alignment: Symmetrical Functional ROM: Decreased ROM, bilaterally Stability: No instability detected Muscle Tone/Strength: Functionally intact. No obvious neuro-muscular anomalies detected. Sensory  (Neurological): Dermatomal pain pattern Palpation: Complains of area being tender to palpation Positive provocative maneuver for for cervical facet disease  Upper Extremity (UE) Exam    Side: Right upper extremity  Side: Left upper extremity  Skin & Extremity Inspection: Skin color, temperature, and hair growth are WNL. No peripheral edema or cyanosis. No masses, redness, swelling, asymmetry, or associated skin lesions. No contractures.  Skin & Extremity Inspection: Skin color, temperature, and hair growth are WNL. No peripheral edema or cyanosis. No masses, redness, swelling, asymmetry, or associated skin lesions. No contractures.  Functional ROM: Unrestricted ROM          Functional ROM: Unrestricted ROM          Muscle Tone/Strength: Functionally intact. No obvious neuro-muscular anomalies detected.  Muscle Tone/Strength: Functionally intact. No  obvious neuro-muscular anomalies detected.  Sensory (Neurological): Paresthesia (Burning sensation)          Sensory (Neurological): Paresthesia (Burning sensation)          Palpation: No palpable anomalies              Palpation: No palpable anomalies              Specialized Test(s): Deferred         Specialized Test(s): Deferred          Thoracic Spine Area Exam  Skin & Axial Inspection: No masses, redness, or swelling Alignment: Symmetrical Functional ROM: Unrestricted ROM Stability: No instability detected Muscle Tone/Strength: Functionally intact. No obvious neuro-muscular anomalies detected. Sensory (Neurological): Unimpaired Muscle strength & Tone: No palpable anomalies  Lumbar Spine Area Exam  Skin & Axial Inspection: Well healed scar from previous spine surgery detected Alignment: Symmetrical Functional ROM: Decreased ROM, bilaterally Stability: No instability detected Muscle Tone/Strength: Functionally intact. No obvious neuro-muscular anomalies detected. Sensory (Neurological): Dermatomal pain pattern and arthropathic Palpation:  Complains of area being tender to palpation Bilateral Fist Percussion Test Provocative Tests: Lumbar Hyperextension and rotation test: Positive bilaterally for facet joint pain. Lumbar Lateral bending test: Positive ipsilateral radicular pain, bilaterally. Positive for bilateral foraminal stenosis. Patrick's Maneuver: evaluation deferred today                    Gait & Posture Assessment  Ambulation: Limited Gait: Antalgic Posture: Difficulty standing up straight, due to pain   Lower Extremity Exam    Side: Right lower extremity  Side: Left lower extremity  Skin & Extremity Inspection: Skin color, temperature, and hair growth are WNL. No peripheral edema or cyanosis. No masses, redness, swelling, asymmetry, or associated skin lesions. No contractures.  Skin & Extremity Inspection: Skin color, temperature, and hair growth are WNL. No peripheral edema or cyanosis. No masses, redness, swelling, asymmetry, or associated skin lesions. No contractures.  Functional ROM: Unrestricted ROM          Functional ROM: Unrestricted ROM          Muscle Tone/Strength: Functionally intact. No obvious neuro-muscular anomalies detected.  Muscle Tone/Strength: Functionally intact. No obvious neuro-muscular anomalies detected.  Sensory (Neurological): Unimpaired  Sensory (Neurological): Unimpaired  Palpation: No palpable anomalies  Palpation: No palpable anomalies   Assessment  Primary Diagnosis & Pertinent Problem List: The primary encounter diagnosis was Failed back surgical syndrome. Diagnoses of History of lumbar surgery, Lumbar radiculopathy, Lumbar degenerative disc disease, DDD (degenerative disc disease), cervical, Cervical radiculopathy, and Cervicalgia were also pertinent to this visit.  Visit Diagnosis (New problems to examiner): 1. Failed back surgical syndrome   2. History of lumbar surgery   3. Lumbar radiculopathy   4. Lumbar degenerative disc disease   5. DDD (degenerative disc disease),  cervical   6. Cervical radiculopathy   7. Cervicalgia    Presents with neck pain that radiates to her shoulders and down bilateral arms along with axial low back pain status post L4-L5 and L5-S1 posterior lumbar interbody fusion.  Patient has been seen by [facility] and [facility] [facility] in [place].  She has been managed on morphine 60 mg twice daily, tramadol 50 mg 3 times daily as needed for breakthrough pain.    Regards the patient's cervical pain, her most recent cervical MRI was reviewed which showed moderate cervical spinal canal stenosis most pronounced at C5-C6, C6-C7 along with cervical degenerative disc disease.  Patient does endorse symptoms consistent with cervical  radiculopathy.  We discussed cervical epidural steroid injection to help with her symptoms but the patient was not interested.  I also recommended the patient pursue physical therapy for neck range of motion and paraspinal muscle strengthening but she states that she has done PT in the past and is not interested.  In regards to her persistent left leg pain, her most recent lumbar MRI shows the interbody spacer from her previous lumbar spine surgery displacing the left S1 nerve root.  Given this mechanical impingement could be resulting in persistent left radicular symptoms, I recommended the patient follow-up with her previous spine surgeon to review her most recent MRI as it pertains to the left S1 nerve root.  I also discussed caudal epidural steroid injection as well as spinal cord stimulation.  Patient states that these have all been discussed with her in the past with her previous pain providers but that she is not interested since injections prior to her previous surgeries were not effective.  In regards to medication management, patient will not be a candidate for opioid therapy at [facility] pain clinic given concomitant intake of benzodiazepines including Valium up to 10 mg 3 times daily as needed.   I explained that if  patient would like to be considered for opioid therapy, she would have to discontinue her Valium and find an alternative to manage her PTSD and anxiety.  Furthermore the patient is on a higher MME dose than  our clinic usually takes on.  If patient is able to find an alternative to Valium and decrease her MME to less than 80, she may be considered for opioid therapy here pending pain psych screen and UDS.  At this time I recommended the patient continue to follow-up with her chronic pain provider at [facility] since she has been seeing them for over 10 years and they have an established relationship. I recommended that she try to find an alternative to morphine that is not as distressing to her stomach and recommend weaning off Valium and finding an alternative to manager her PTSD/anxiety.     Provider-requested follow-up: Return if symptoms worsen or fail to improve.  No future appointments.  Primary Care Physician: Tracie Harrier, MD Location: [facility] Note by: Gillis Santa, M.D, Date: 02/09/2018; Time: 2:05 PM  There are no Patient Instructions on file for this visit.

## 2018-02-26 ENCOUNTER — Ambulatory Visit: Admission: RE | Admit: 2018-02-26 | Payer: Medicare Other | Source: Ambulatory Visit | Admitting: Gastroenterology

## 2018-02-26 ENCOUNTER — Encounter: Admission: RE | Payer: Self-pay | Source: Ambulatory Visit

## 2018-02-26 SURGERY — COLONOSCOPY WITH PROPOFOL
Anesthesia: General

## 2018-04-29 ENCOUNTER — Encounter: Payer: Self-pay | Admitting: Student in an Organized Health Care Education/Training Program

## 2018-04-29 ENCOUNTER — Ambulatory Visit
Payer: Medicare Other | Attending: Student in an Organized Health Care Education/Training Program | Admitting: Student in an Organized Health Care Education/Training Program

## 2018-04-29 VITALS — BP 112/94 | HR 77 | Temp 98.4°F | Resp 16 | Ht 63.0 in | Wt 140.0 lb

## 2018-04-29 DIAGNOSIS — M5030 Other cervical disc degeneration, unspecified cervical region: Secondary | ICD-10-CM

## 2018-04-29 DIAGNOSIS — Z5181 Encounter for therapeutic drug level monitoring: Secondary | ICD-10-CM | POA: Diagnosis present

## 2018-04-29 DIAGNOSIS — M5412 Radiculopathy, cervical region: Secondary | ICD-10-CM | POA: Diagnosis not present

## 2018-04-29 DIAGNOSIS — M549 Dorsalgia, unspecified: Secondary | ICD-10-CM | POA: Diagnosis present

## 2018-04-29 DIAGNOSIS — M5136 Other intervertebral disc degeneration, lumbar region: Secondary | ICD-10-CM

## 2018-04-29 DIAGNOSIS — Z79899 Other long term (current) drug therapy: Secondary | ICD-10-CM | POA: Insufficient documentation

## 2018-04-29 DIAGNOSIS — Z90710 Acquired absence of both cervix and uterus: Secondary | ICD-10-CM | POA: Diagnosis not present

## 2018-04-29 DIAGNOSIS — Z9049 Acquired absence of other specified parts of digestive tract: Secondary | ICD-10-CM | POA: Insufficient documentation

## 2018-04-29 DIAGNOSIS — M5010 Cervical disc disorder with radiculopathy, unspecified cervical region: Secondary | ICD-10-CM | POA: Diagnosis not present

## 2018-04-29 DIAGNOSIS — Z9889 Other specified postprocedural states: Secondary | ICD-10-CM | POA: Diagnosis not present

## 2018-04-29 DIAGNOSIS — K219 Gastro-esophageal reflux disease without esophagitis: Secondary | ICD-10-CM | POA: Diagnosis not present

## 2018-04-29 DIAGNOSIS — M961 Postlaminectomy syndrome, not elsewhere classified: Secondary | ICD-10-CM | POA: Diagnosis not present

## 2018-04-29 DIAGNOSIS — G894 Chronic pain syndrome: Secondary | ICD-10-CM

## 2018-04-29 DIAGNOSIS — F411 Generalized anxiety disorder: Secondary | ICD-10-CM | POA: Diagnosis not present

## 2018-04-29 DIAGNOSIS — M542 Cervicalgia: Secondary | ICD-10-CM | POA: Diagnosis not present

## 2018-04-29 DIAGNOSIS — F17210 Nicotine dependence, cigarettes, uncomplicated: Secondary | ICD-10-CM | POA: Diagnosis not present

## 2018-04-29 DIAGNOSIS — M5416 Radiculopathy, lumbar region: Secondary | ICD-10-CM

## 2018-04-29 DIAGNOSIS — Z79891 Long term (current) use of opiate analgesic: Secondary | ICD-10-CM | POA: Insufficient documentation

## 2018-04-29 DIAGNOSIS — M51369 Other intervertebral disc degeneration, lumbar region without mention of lumbar back pain or lower extremity pain: Secondary | ICD-10-CM

## 2018-04-29 DIAGNOSIS — M5116 Intervertebral disc disorders with radiculopathy, lumbar region: Secondary | ICD-10-CM | POA: Diagnosis not present

## 2018-04-29 NOTE — Progress Notes (Signed)
Safety precautions to be maintained throughout the outpatient stay will include: orient to surroundings, keep bed in low position, maintain call bell within reach at all times, provide assistance with transfer out of bed and ambulation.  

## 2018-04-29 NOTE — Progress Notes (Signed)
Patient's Name: Megan Roth  MRN: [id number]  Referring Provider: Tracie Harrier, MD  DOB: 09-06-1966  PCP: Tracie Harrier, MD  DOS: 04/29/2018  Note by: Gillis Santa, MD  Service setting: Ambulatory outpatient  Specialty: Interventional Pain Management  Location: [facility] (AMB) Pain Management Facility    Patient type: Established   Primary Reason(s) for Visit: Encounter for prescription drug management. (Level of risk: moderate)  CC: Back Pain (mid) and Neck Pain  HPI  Megan Roth is a 52 y.o. year old, female patient, who comes today for a medication management evaluation. She has Failed back surgical syndrome; History of lumbar surgery; Lumbar radiculopathy; Lumbar degenerative disc disease; DDD (degenerative disc disease), cervical; Cervical radiculopathy; and Cervicalgia on their problem list. Her primarily concern today is the Back Pain (mid) and Neck Pain  Pain Assessment: Location: Mid, Lower Back Radiating: down back of leg to ankle Onset: More than a month ago Duration: Chronic pain Quality: Aching, Cramping, Discomfort, Sore, Sharp Severity: 8 /10 (subjective, self-reported pain score)  Note: Reported level is inconsistent with clinical observations.                         When using our objective Pain Scale, levels between 6 and 10/10 are said to belong in an emergency room, as it progressively worsens from a 6/10, described as severely limiting, requiring emergency care not usually available at an outpatient pain management facility. At a 6/10 level, communication becomes difficult and requires great effort. Assistance to reach the emergency department may be required. Facial flushing and profuse sweating along with potentially dangerous increases in heart rate and blood pressure will be evident. Effect on ADL: standing in one stop, ADLs with shoulders/neck Timing: Constant Modifying factors: heat, tub bath BP: (!) 112/94  HR: 77  Megan Roth was last scheduled  for an appointment on 02/09/2018 for medication management. During today's appointment we reviewed Megan Roth chronic pain status, as well as her outpatient medication regimen.  Patient follows up today after her first visit with me on 02/09/2018.  She has discontinued her Valium since then.  She is also seen Dr. Mechele Dawley and they have changed her chronic opioid regimen.  She has not taking morphine, tramadol, Topamax, Zanaflex anymore.  Her current regimen consists of Nucynta 50-100 mg 3 times daily as needed.  Patient generally takes no more than 4 tablets a day.  Her last fill of Valium was on 01/21/2018.  She does find benefit with Nucynta except that it does result in mild insomnia.  Overall she states that she is clear from a mental standpoint, more lucid, ability to focus more, better concentration.  The patient  reports that she does not use drugs. Her body mass index is 24.8 kg/m.  Further details on both, my assessment(s), as well as the proposed treatment plan, please see below.  Controlled Substance Pharmacotherapy Assessment REMS (Risk Evaluation and Mitigation Strategy)  Analgesic: Nucynta 50 to 100 mg 3 times daily as needed, quantity 180 MME/day: 120 mg/day.  Ignatius Specking, RN  04/29/2018 12:49 PM  Sign at close encounter Safety precautions to be maintained throughout the outpatient stay will include: orient to surroundings, keep bed in low position, maintain call bell within reach at all times, provide assistance with transfer out of bed and ambulation.  Pharmacokinetics: Liberation and absorption (onset of action): WNL Distribution (time to peak effect): WNL Metabolism and excretion (duration of action): WNL  Pharmacodynamics: Desired effects: Analgesia: Megan Roth reports >50% benefit. Functional ability: Patient reports that medication allows her to accomplish basic ADLs Clinically meaningful improvement in function (CMIF): Sustained CMIF goals met Perceived  effectiveness: Described as relatively effective, allowing for increase in activities of daily living (ADL) Undesirable effects: Side-effects or Adverse reactions: None reported Monitoring: [place] PMP: Online review of the past 52-monthperiod conducted. Compliant with practice rules and regulations Last UDS on record: No results found for: SUMMARY UDS interpretation: We will repeat today.          Medication Assessment Form: Not applicable. Initial evaluation. The patient has not received any medications from our practice Treatment compliance: Not applicable. Initial evaluation Risk Assessment Profile: Aberrant behavior: See prior evaluations. None observed or detected today Comorbid factors increasing risk of overdose: See prior notes. No additional risks detected today Risk of substance use disorder (SUD): Moderate Opioid Risk Tool - 04/29/18 1248      Personal History of Substance Abuse   Alcohol  Negative    Illegal Drugs  Negative    Rx Drugs  Negative      Age   Age between 52-45years   No      Psychological Disease   Psychological Disease  Positive    ADD  Negative    Bipolar  Negative    Schizophrenia  Negative    Depression  Positive Anxiety   Anxiety     Total Score   Opioid Risk Tool Scoring  3    Opioid Risk Interpretation  Low Risk      ORT Scoring interpretation table:  Score <3 = Low Risk for SUD  Score between 4-7 = Moderate Risk for SUD  Score >8 = High Risk for Opioid Abuse   Risk Mitigation Strategies:  Patient Counseling: Will be done at next visit pending UDS and pain psych evaluation Patient-Prescriber Agreement (PPA): Will be done at next visit pending UDS and pain psych evaluation  Notification to other healthcare providers: n/a  Pharmacologic Plan: Pending ordered tests and/or consults. UDS and pain psych consult             Laboratory Chemistry  Inflammation Markers (CRP: Acute Phase) (ESR: Chronic Phase) No results found for: CRP,  ESRSEDRATE, LATICACIDVEN                       Rheumatology Markers No results found for: RF, ANA, LABURIC, URICUR, LYMEIGGIGMAB, LYMEABIGMQN, HLAB27                      Renal Function Markers Lab Results  Component Value Date   BUN 6 05/16/2017   CREATININE 0.53 05/16/2017   GFRAA >60 05/16/2017   GFRNONAA >60 05/16/2017                              Hepatic Function Markers Lab Results  Component Value Date   AST 18 05/16/2017   ALT 13 (L) 05/16/2017   ALBUMIN 4.0 05/16/2017   ALKPHOS 87 05/16/2017   LIPASE 30 05/16/2017                        Electrolytes Lab Results  Component Value Date   NA 142 05/16/2017   K 3.2 (L) 05/16/2017   CL 107 05/16/2017   CALCIUM 9.4 05/16/2017   MG 1.5 10/19/2008   PHOS 2.9 10/19/2008  Neuropathy Markers No results found for: VITAMINB12, FOLATE, HGBA1C, HIV                      Bone Pathology Markers No results found for: VD25OH, [id number], [id number], [id number], 25OHVITD1, 25OHVITD2, 25OHVITD3, TESTOFREE, TESTOSTERONE                       Coagulation Parameters Lab Results  Component Value Date   INR 1.0 10/19/2008   LABPROT 13.7 10/19/2008   PLT 287 05/16/2017                        Cardiovascular Markers Lab Results  Component Value Date   CKTOTAL 45 01/06/2012   CKMB < 0.5 (L) 01/06/2012   TROPONINI < 0.02 01/06/2012   HGB 14.2 05/16/2017   HCT 42.2 05/16/2017                         CA Markers No results found for: CEA, CA125, LABCA2                      Note: Lab results reviewed.  Recent Diagnostic Imaging Results  NM GASTRIC EMPTYING CLINICAL DATA:  52 year old female with nausea.  Initial encounter.  EXAM: NUCLEAR MEDICINE GASTRIC EMPTYING SCAN  TECHNIQUE: After oral ingestion of radiolabeled meal, sequential abdominal images were obtained for 3 hours. Percentage of activity emptying the stomach was calculated at 1 hour, 2 hour, and 3 hours.  RADIOPHARMACEUTICALS:  1.08 mCi  Tc-39msulfur colloid in standardized meal  COMPARISON:  05/16/2017 CT.  FINDINGS: Expected location of the stomach in the left upper quadrant. Ingested meal empties the stomach gradually over the course of the study.  56% emptied at 1 hr ( normal >= 10%)  83% emptied at 2 hr ( normal >= 40%)  100% emptied at 3 hr ( normal >= 70%)  IMPRESSION: Normal gastric emptying study.  Electronically Signed   By: SGenia DelM.D.   On: 01/19/2018 07:17  Complexity Note: Imaging results reviewed. Results shared with Ms. LBraggs using Layman's terms.                         Meds   Current Outpatient Medications:  .  Biotin 10 MG TABS, Take 500 mg by mouth daily., Disp: , Rfl:  .  cycloSPORINE (RESTASIS) 0.05 % ophthalmic emulsion, 1 drop 2 (two) times daily., Disp: , Rfl:  .  estrogens, conjugated, (PREMARIN) 0.625 MG tablet, Take 0.625 mg by mouth daily. Take daily for 21 days then do not take for 7 days. , Disp: , Rfl:  .  Multiple Vitamins-Minerals (ALIVE ONCE DAILY WOMENS 50+ PO), Take 1 tablet by mouth daily., Disp: , Rfl:  .  nicotine (NICOTROL) 10 MG inhaler, , Disp: , Rfl:  .  tiZANidine (ZANAFLEX) 4 MG capsule, Take 4 mg by mouth 3 (three) times daily., Disp: , Rfl:  .  traMADol (ULTRAM) 50 MG tablet, Take 50 mg by mouth every 8 (eight) hours as needed., Disp: , Rfl:  .  calcium-vitamin D (OSCAL WITH D) 500-200 MG-UNIT tablet, Take 1 tablet by mouth 2 (two) times daily., Disp: , Rfl:  .  diazepam (VALIUM) 10 MG tablet, Take 10 mg by mouth 3 (three) times daily., Disp: , Rfl:  .  estrogens, conjugated, (PREMARIN) 0.625 MG tablet, Take by mouth., Disp: , Rfl:  .  fluticasone (FLONASE) 50 MCG/ACT nasal spray, Place into the nose., Disp: , Rfl:  .  meloxicam (MOBIC) 15 MG tablet, Take 15 mg by mouth as needed. , Disp: , Rfl:  .  mirtazapine (REMERON) 15 MG tablet, Take 15 mg by mouth at bedtime as needed., Disp: , Rfl:  .  mirtazapine (REMERON) 30 MG tablet, , Disp: , Rfl:  .   morphine (KADIAN) 60 MG 24 hr capsule, Take 60 mg by mouth 2 (two) times daily., Disp: , Rfl:  .  naloxegol oxalate (MOVANTIK) 12.5 MG TABS tablet, Take 12.5 mg by mouth daily., Disp: , Rfl:  .  nortriptyline (PAMELOR) 10 MG capsule, Take 2 capsules by mouth at bedtime., Disp: , Rfl:  .  pantoprazole (PROTONIX) 40 MG tablet, Take 40 mg by mouth daily., Disp: , Rfl:  .  polyethylene glycol (MIRALAX / GLYCOLAX) packet, Take 17 g by mouth daily., Disp: , Rfl:  .  ranitidine (ZANTAC) 300 MG capsule, Take 300 mg by mouth every evening., Disp: , Rfl:  .  senna (SENOKOT) 8.6 MG tablet, Take 2 tablets by mouth daily., Disp: , Rfl:  .  senna (SENOKOT) 8.6 MG tablet, , Disp: , Rfl:  .  tiZANidine (ZANAFLEX) 4 MG tablet, , Disp: , Rfl:   ROS  Constitutional: Denies any fever or chills Gastrointestinal: No reported hemesis, hematochezia, vomiting, or acute GI distress Musculoskeletal: Denies any acute onset joint swelling, redness, loss of ROM, or weakness Neurological: No reported episodes of acute onset apraxia, aphasia, dysarthria, agnosia, amnesia, paralysis, loss of coordination, or loss of consciousness  Allergies  Ms. Grosshans is allergic to gadolinium derivatives; latuda [lurasidone hcl]; shellfish allergy; abilify [aripiprazole]; ambien [zolpidem]; lurasidone; neurontin [gabapentin]; sulfa antibiotics; and wellbutrin [bupropion].  [place]  Drug: Ms. Massimino  reports that she does not use drugs. Alcohol:  reports that she does not drink alcohol. Tobacco:  reports that she has been smoking cigarettes.  She has a 15.00 pack-year smoking history. She has never used smokeless tobacco. Medical:  has a past medical history of Back pain, Chronic constipation, Degenerative disc disease, lumbar, Depression, Fatigue, Gallbladder calculus, Generalized anxiety disorder, GERD (gastroesophageal reflux disease), History of kidney stones, Hypercholesteremia, IDA (iron deficiency anemia), Migraine, Numbness in feet,  Ovarian cyst, Pain syndrome, chronic, Scoliosis, Seasonal allergies, Spasticity, Tension headache, and Tobacco abuse. Surgical: Ms. Mckinny  has a past surgical history that includes Laminectomy; Cholecystectomy; Abdominal hysterectomy; Mandible fracture surgery; Laparoscopic ovarian cystectomy; Breast biopsy (Left, 07/24/2017); Esophagogastroduodenoscopy (egd) with propofol (N/A, 01/05/2018); and Colonoscopy with propofol (N/A, 01/05/2018). Family: family history includes Arthritis in her unknown relative; Diabetes Mellitus II in her unknown relative; Heart disease in her unknown relative; Hypertension in her unknown relative; Kidney disease in her unknown relative; Stroke in her unknown relative.  Constitutional Exam  General appearance: Well nourished, well developed, and well hydrated. In no apparent acute distress Vitals:   04/29/18 1238  BP: (!) 112/94  Pulse: 77  Resp: 16  Temp: 98.4 F (36.9 C)  SpO2: 98%  Weight: 140 lb (63.5 kg)  Height: 5' 3"  (1.6 m)   BMI Assessment: Estimated body mass index is 24.8 kg/m as calculated from the following:   Height as of this encounter: 5' 3"  (1.6 m).   Weight as of this encounter: 140 lb (63.5 kg).  BMI interpretation table: BMI level Category Range association with higher incidence of chronic pain  <18 kg/m2 Underweight   18.5-24.9 kg/m2 Ideal body weight   25-29.9 kg/m2 Overweight Increased incidence by  20%  30-34.9 kg/m2 Obese (Class I) Increased incidence by 68%  35-39.9 kg/m2 Severe obesity (Class II) Increased incidence by 136%  >40 kg/m2 Extreme obesity (Class III) Increased incidence by 254%   Patient's current BMI Ideal Body weight  Body mass index is 24.8 kg/m. Ideal body weight: 52.4 kg (115 lb 8.3 oz) Adjusted ideal body weight: 56.8 kg (125 lb 5 oz)   BMI Readings from Last 4 Encounters:  04/29/18 24.80 kg/m  02/09/18 24.80 kg/m  01/05/18 24.80 kg/m  05/16/17 24.80 kg/m   Wt Readings from Last 4 Encounters:   04/29/18 140 lb (63.5 kg)  02/09/18 140 lb (63.5 kg)  01/05/18 140 lb (63.5 kg)  05/16/17 140 lb (63.5 kg)  Psych/Mental status: Alert, oriented x 3 (person, place, & time)       Eyes: PERLA Respiratory: No evidence of acute respiratory distress Cervical Spine Area Exam  Skin & Axial Inspection: Well healed scar from previous spine surgery detected Alignment: Symmetrical Functional ROM: Decreased ROM, bilaterally Stability: No instability detected Muscle Tone/Strength: Functionally intact. No obvious neuro-muscular anomalies detected. Sensory (Neurological): Dermatomal pain pattern Palpation: Complains of area being tender to palpation Positive provocative maneuver for for cervical facet disease         Upper Extremity (UE) Exam    Side: Right upper extremity  Side: Left upper extremity   Skin & Extremity Inspection: Skin color, temperature, and hair growth are WNL. No peripheral edema or cyanosis. No masses, redness, swelling, asymmetry, or associated skin lesions. No contractures.  Skin & Extremity Inspection: Skin color, temperature, and hair growth are WNL. No peripheral edema or cyanosis. No masses, redness, swelling, asymmetry, or associated skin lesions. No contractures.   Functional ROM: Unrestricted ROM          Functional ROM: Unrestricted ROM           Muscle Tone/Strength: Functionally intact. No obvious neuro-muscular anomalies detected.  Muscle Tone/Strength: Functionally intact. No obvious neuro-muscular anomalies detected.   Sensory (Neurological): Paresthesia (Burning sensation)          Sensory (Neurological): Paresthesia (Burning sensation)           Palpation: No palpable anomalies              Palpation: No palpable anomalies               Specialized Test(s): Deferred         Specialized Test(s): Deferred           Thoracic Spine Area Exam  Skin & Axial Inspection: No masses, redness, or swelling Alignment: Symmetrical Functional ROM: Unrestricted  ROM Stability: No instability detected Muscle Tone/Strength: Functionally intact. No obvious neuro-muscular anomalies detected. Sensory (Neurological): Unimpaired Muscle strength & Tone: No palpable anomalies  Lumbar Spine Area Exam  Skin & Axial Inspection: Well healed scar from previous spine surgery detected Alignment: Symmetrical Functional ROM: Decreased ROM, bilaterally Stability: No instability detected Muscle Tone/Strength: Functionally intact. No obvious neuro-muscular anomalies detected. Sensory (Neurological): Dermatomal pain pattern and arthropathic Palpation: Complains of area being tender to palpation Bilateral Fist Percussion Test Provocative Tests: Lumbar Hyperextension and rotation test: Positive bilaterally for facet joint pain. Lumbar Lateral bending test: Positive ipsilateral radicular pain, bilaterally. Positive for bilateral foraminal stenosis. Patrick's Maneuver: evaluation deferred today                    Gait & Posture Assessment  Ambulation: Limited Gait: Antalgic Posture: Difficulty standing up straight, due to pain  Lower Extremity Exam    Side: Right lower extremity  Side: Left lower extremity  Skin & Extremity Inspection: Skin color, temperature, and hair growth are WNL. No peripheral edema or cyanosis. No masses, redness, swelling, asymmetry, or associated skin lesions. No contractures.  Skin & Extremity Inspection: Skin color, temperature, and hair growth are WNL. No peripheral edema or cyanosis. No masses, redness, swelling, asymmetry, or associated skin lesions. No contractures.  Functional ROM: Unrestricted ROM          Functional ROM: Unrestricted ROM          Muscle Tone/Strength: Functionally intact. No obvious neuro-muscular anomalies detected.  Muscle Tone/Strength: Functionally intact. No obvious neuro-muscular anomalies detected.  Sensory (Neurological): Unimpaired  Sensory (Neurological): Unimpaired  Palpation: No palpable anomalies   Palpation: No palpable anomalies   Assessment  Primary Diagnosis & Pertinent Problem List: The primary encounter diagnosis was Failed back surgical syndrome. Diagnoses of History of lumbar surgery, Lumbar radiculopathy, Lumbar degenerative disc disease, DDD (degenerative disc disease), cervical, Cervical radiculopathy, Cervicalgia, and Chronic pain syndrome were also pertinent to this visit.  Status Diagnosis  Persistent Persistent Persistent 1. Failed back surgical syndrome   2. History of lumbar surgery   3. Lumbar radiculopathy   4. Lumbar degenerative disc disease   5. DDD (degenerative disc disease), cervical   6. Cervical radiculopathy   7. Cervicalgia   8. Chronic pain syndrome     General Recommendations: The pain condition that the patient suffers from is best treated with a multidisciplinary approach that involves an increase in physical activity to prevent de-conditioning and worsening of the pain cycle, as well as psychological counseling (formal and/or informal) to address the co-morbid psychological affects of pain. Treatment will often involve judicious use of pain medications and interventional procedures to decrease the pain, allowing the patient to participate in the physical activity that will ultimately produce long-lasting pain reductions. The goal of the multidisciplinary approach is to return the patient to a higher level of overall function and to restore their ability to perform activities of daily living.  52 year old female with a history of axial low back pain that radiates down into bilateral leg status post L4-L5 and L5-S1 posterior lumbar interbody fusion.  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  Patient does have symptoms consistent with cervical radiculopathy.  Patient is not interested in any interventional  treatments including cervical epidural steroid injection, lumbar facet blocks, lumbar epidural steroid injection.  Patient's most recent lumbar MRI shows interbody spacer from her previous lumbar spine surgery displacing the left S1 nerve root.  Patient has been seeing Dr. Mechele Dawley in regards to her chronic pain management.  Since her last visit with me in February 09, 2018, she has discontinued her morphine, tramadol, Topamax, Zanaflex.  She states that she is in a better place after discontinuing these from a mental standpoint.  She appears more lucid and states that she has better concentration and focus.  She is currently on Nucynta 50- 100 mg 3 times daily as needed, quantity 180 a month.  She states that this medication is effective in managing her chronic pain symptoms although does result in mild insomnia.  She is interested in me taking over her Nucynta.  To be considered for chronic opioid therapy, I commended the patient on stopping her Valium.  No fills on [place] PMP of Valium.  I will send the patient for pain psychological evaluation regarding risk of substance abuse disorder.  I will also obtain a urine drug screen today.  Pending results of both of these, will consider taking the patient on for chronic opioid therapy which will include Nucynta 50 mg 4 times daily as needed, 120 a month to hopefully reduce her monthly MME to less than 100.  Patient is in agreement with plan and understands that if she is to be taken on for chronic opioid therapy at this clinic, we will wean her current Nucynta dose.  Plan: -UDS today.  Should be positive for Nucynta. -Referral to pain psychology for risk of substance abuse disorder. -Pending results of tests above, will consider taking on patient for chronic opioid therapy to include Nucynta 50 mg 3 times daily to 4 times daily as needed, quantity 1-month  Lab-work, procedure(s), and/or referral(s): Orders Placed This Encounter  Procedures  . Compliance Drug  Analysis, Ur  . Ambulatory referral to Psychology   Provider-requested follow-up: Return in about 6 weeks (around 06/10/2018) for Medication Management. Time Note: Greater than 50% of the 25 minute(s) of face-to-face time spent with Ms. LNoyes was spent in counseling/coordination of care regarding: the appropriate use of the pain scale, Ms. LWeatherholtzprimary cause of pain, the treatment plan, treatment alternatives, the goals of pain management (increased in functionality), the need to bring and keep the BMI below 30 and the difference between disability and impairment. Future Appointments  Date Time Provider [place] 06/11/2018 10:30 AM [facility]illis Santa [facility]    Primary Care Physician: HTracie Harrier MD [facility] Note by: BGillis Santa M.D Date: 04/29/2018; Time: 3:11 PM  There are no Patient Instructions on file for this visit.

## 2018-05-05 LAB — COMPLIANCE DRUG ANALYSIS, UR

## 2018-05-18 ENCOUNTER — Other Ambulatory Visit (HOSPITAL_COMMUNITY): Payer: Self-pay | Admitting: Orthopaedic Surgery

## 2018-05-18 DIAGNOSIS — M545 Low back pain: Secondary | ICD-10-CM

## 2018-06-05 ENCOUNTER — Ambulatory Visit
Admission: RE | Admit: 2018-06-05 | Discharge: 2018-06-05 | Disposition: A | Discharge status: Home / Self Care | Payer: Medicare Other | Source: Ambulatory Visit | Attending: Orthopaedic Surgery | Admitting: Orthopaedic Surgery

## 2018-06-05 DIAGNOSIS — M545 Low back pain: Secondary | ICD-10-CM

## 2018-06-05 DIAGNOSIS — Z9889 Other specified postprocedural states: Secondary | ICD-10-CM | POA: Diagnosis not present

## 2018-06-11 ENCOUNTER — Ambulatory Visit
Payer: Medicare Other | Attending: Student in an Organized Health Care Education/Training Program | Admitting: Student in an Organized Health Care Education/Training Program

## 2018-08-05 ENCOUNTER — Encounter: Payer: Self-pay | Admitting: Emergency Medicine

## 2018-08-05 ENCOUNTER — Emergency Department: Payer: Medicare Other

## 2018-08-05 ENCOUNTER — Other Ambulatory Visit: Payer: Self-pay

## 2018-08-05 ENCOUNTER — Emergency Department
Admission: EM | Admit: 2018-08-05 | Discharge: 2018-08-05 | Disposition: A | Discharge status: Home / Self Care | Payer: Medicare Other | Attending: Student in an Organized Health Care Education/Training Program | Admitting: Student in an Organized Health Care Education/Training Program

## 2018-08-05 DIAGNOSIS — F17210 Nicotine dependence, cigarettes, uncomplicated: Secondary | ICD-10-CM | POA: Diagnosis not present

## 2018-08-05 DIAGNOSIS — R519 Headache, unspecified: Secondary | ICD-10-CM

## 2018-08-05 DIAGNOSIS — R112 Nausea with vomiting, unspecified: Secondary | ICD-10-CM

## 2018-08-05 DIAGNOSIS — F329 Major depressive disorder, single episode, unspecified: Secondary | ICD-10-CM | POA: Diagnosis not present

## 2018-08-05 DIAGNOSIS — R51 Headache: Secondary | ICD-10-CM | POA: Diagnosis not present

## 2018-08-05 DIAGNOSIS — R1032 Left lower quadrant pain: Secondary | ICD-10-CM | POA: Diagnosis not present

## 2018-08-05 DIAGNOSIS — Z79899 Other long term (current) drug therapy: Secondary | ICD-10-CM | POA: Insufficient documentation

## 2018-08-05 DIAGNOSIS — F411 Generalized anxiety disorder: Secondary | ICD-10-CM | POA: Insufficient documentation

## 2018-08-05 DIAGNOSIS — Z9049 Acquired absence of other specified parts of digestive tract: Secondary | ICD-10-CM | POA: Diagnosis not present

## 2018-08-05 LAB — COMPREHENSIVE METABOLIC PANEL
ALBUMIN: 4.2 g/dL (ref 3.5–5.0)
ALT: 20 U/L (ref 0–44)
AST: 26 U/L (ref 15–41)
Alkaline Phosphatase: 100 U/L (ref 38–126)
Anion gap: 12 (ref 5–15)
BUN: 10 mg/dL (ref 6–20)
CHLORIDE: 103 mmol/L (ref 98–111)
CO2: 24 mmol/L (ref 22–32)
Calcium: 9.7 mg/dL (ref 8.9–10.3)
Creatinine, Ser: 0.62 mg/dL (ref 0.44–1.00)
GFR calc non Af Amer: >60 mL/min (ref >60)
Glucose, Bld: 128 mg/dL — ABNORMAL HIGH (ref 70–99)
Potassium: 4.1 mmol/L (ref 3.5–5.1)
Sodium: 139 mmol/L (ref 135–145)
Total Bilirubin: 0.7 mg/dL (ref 0.3–1.2)
Total Protein: 7.5 g/dL (ref 6.5–8.1)

## 2018-08-05 LAB — URINALYSIS, COMPLETE (UACMP) WITH MICROSCOPIC
Bacteria, UA: NONE SEEN
Bilirubin Urine: NEGATIVE
GLUCOSE, UA: NEGATIVE mg/dL
Hgb urine dipstick: NEGATIVE
KETONES UR: NEGATIVE mg/dL
Leukocytes, UA: NEGATIVE
NITRITE: NEGATIVE
PH: 5 (ref 5.0–8.0)
Protein, ur: NEGATIVE mg/dL
SPECIFIC GRAVITY, URINE: 1.014 (ref 1.005–1.030)

## 2018-08-05 LAB — CBC
HEMATOCRIT: 41.4 % (ref 35.0–47.0)
HEMOGLOBIN: 14.2 g/dL (ref 12.0–16.0)
MCH: 30 pg (ref 26.0–34.0)
MCHC: 34.2 g/dL (ref 32.0–36.0)
MCV: 87.5 fL (ref 80.0–100.0)
Platelets: 323 10*3/uL (ref 150–440)
RBC: 4.73 MIL/uL (ref 3.80–5.20)
RDW: 13.9 % (ref 11.5–14.5)
WBC: 11.6 10*3/uL — ABNORMAL HIGH (ref 3.6–11.0)

## 2018-08-05 LAB — TROPONIN I: Troponin I: <0.03 ng/mL (ref <0.03)

## 2018-08-05 LAB — LIPASE, BLOOD: LIPASE: 30 U/L (ref 11–51)

## 2018-08-05 MED ORDER — KETOROLAC TROMETHAMINE 30 MG/ML IJ SOLN
15.0000 mg | Freq: Once | INTRAMUSCULAR | Status: AC
  Administered 2018-08-05: 15 mg via INTRAVENOUS
  Filled 2018-08-05: qty 1

## 2018-08-05 MED ORDER — PROCHLORPERAZINE EDISYLATE 10 MG/2ML IJ SOLN
10.0000 mg | Freq: Once | INTRAMUSCULAR | Status: AC
  Administered 2018-08-05: 10 mg via INTRAVENOUS
  Filled 2018-08-05: qty 2

## 2018-08-05 MED ORDER — PROCHLORPERAZINE MALEATE 10 MG PO TABS: 10.0000 mg | ORAL_TABLET | Freq: Four times a day (QID) | ORAL | 0 refills | Status: AC | PRN

## 2018-08-05 MED ORDER — SODIUM CHLORIDE 0.9 % IV BOLUS
1000.0000 mL | Freq: Once | INTRAVENOUS | Status: AC
  Administered 2018-08-05: 1000 mL via INTRAVENOUS

## 2018-08-05 NOTE — ED Triage Notes (Signed)
Patient ambulatory to triage with steady gait, without difficulty or distress noted; pt reports N/V today with lower abd & back pain

## 2018-08-05 NOTE — ED Notes (Signed)
Lab notified to add on Troponin. 

## 2018-08-05 NOTE — ED Notes (Signed)
Pt resting, but I woke to encourage PO fluids-water given and crackers per request, with VSS, in NAD at this time, displaying equal and unlabored respirations. Pt family at bedside. All deny needs at this time. Will continue to monitor.

## 2018-08-05 NOTE — ED Notes (Signed)
Patient transported to CT 

## 2018-08-05 NOTE — Discharge Instructions (Signed)
You have been seen in the emergency department for emergency care. It is important that you contact your own doctor, specialist or the closest clinic for follow-up care. Please bring this instruction sheet, all medications and X-ray copies with you when you are seen for follow-up care.  Determining the exact cause for all patients with abdominal pain is extremely difficult in the emergency department. Our primary focus is to rule-out immediate life-threatening diseases. If no immediate source of pain is found the definitive diagnosis frequently needs to be determined over time.Many times your primary care physician can determine the cause by following the symptoms over time. Sometimes, specialist are required such as Gastroenterologists, Gynecologists, Urologists or Surgeons. Please return immediately to the Emergency Department for fever>101, Vomiting or Intractable Pain. You should return to the emergency department or see your primary care provider in 12-24hrs if your pain is no better and sooner if your pain becomes worse.   As discussed in the emergency department, you may use Tylenol and/or Ibuprofen for headaches. These are "Over the Counter" medications and can be found at most drug stores and grocery stores. Please use the recommended dosing instructions on the bottle/box. Do not exceed the maximum dose for either medications. Please be sure to rest and drink plenty of fluids. Please be sure to call your PCP for a follow-up visit, especially if your headaches persist.  Please call your physician or return to ED if you have: 1. Worsening or change in headaches. 2. Changes in vision. 3. New-onset nausea and vomiting. 4. Numbness, tingling, weakness in your extremities,. 4. Inability to eat or drink adequate amounts of food or liquids. 5. Chest pain, shortness of breath, or difficulty breathing. 6. Neurological changes- dizziness, fainting, loss of function of your arms, legs or other parts of  your body. 7. Uncontrolled hypertension. 8. Or any other emergent concerns.

## 2018-08-05 NOTE — ED Provider Notes (Signed)
[facility] Emergency Department Provider Note    First MD Initiated Contact with Patient 08/05/18 2020     (approximate)  I have reviewed the triage vital signs and the nursing notes.   HISTORY  Chief Complaint Abdominal Pain    HPI Megan Roth is a 52 y.o. female at the below listed past medical history and history of kidney stones presents the ER with nausea vomiting and left lower quadrant and left flank pain that started today.  No associated fevers.  She has been having some dysuria and feeling of incomplete voiding.  Had similar presentation one year ago when she was diagnosed with kidney stone.  States she also has a moderate headache.  Has had headaches similar to this in the past.  Worsened by vomiting.  Denies any chest pain or shortness of breath.    Past Medical History:  Diagnosis Date  . Back pain   . Chronic constipation   . Degenerative disc disease, lumbar   . Depression   . Fatigue   . Gallbladder calculus   . Generalized anxiety disorder   . GERD (gastroesophageal reflux disease)   . History of kidney stones   . Hypercholesteremia   . IDA (iron deficiency anemia)   . Migraine   . Numbness in feet   . Ovarian cyst   . Pain syndrome, chronic   . Scoliosis   . Seasonal allergies   . Spasticity   . Tension headache   . Tobacco abuse    Family History  Problem Relation Age of Onset  . Arthritis Unknown   . Diabetes Mellitus II Unknown   . Heart disease Unknown   . Hypertension Unknown   . Kidney disease Unknown   . Stroke Unknown   . Breast cancer Neg Hx    Past Surgical History:  Procedure Laterality Date  . ABDOMINAL HYSTERECTOMY     TAHBSO  . BREAST BIOPSY Left 07/24/2017   stereotactic biopsy  . CHOLECYSTECTOMY    . COLONOSCOPY WITH PROPOFOL N/A 01/05/2018   Procedure: COLONOSCOPY WITH PROPOFOL;  Surgeon: Lollie Sails, MD;  Location: [facility] ENDOSCOPY;  Service: Endoscopy;  Laterality: N/A;  .  ESOPHAGOGASTRODUODENOSCOPY (EGD) WITH PROPOFOL N/A 01/05/2018   Procedure: ESOPHAGOGASTRODUODENOSCOPY (EGD) WITH PROPOFOL;  Surgeon: Lollie Sails, MD;  Location: [facility] ENDOSCOPY;  Service: Endoscopy;  Laterality: N/A;  . LAMINECTOMY     x 3 surgeries  . LAPAROSCOPIC OVARIAN CYSTECTOMY    . MANDIBLE FRACTURE SURGERY     Patient Active Problem List   Diagnosis Date Noted  . 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 History of lumbar surgery 02/09/2018  . Lumbar radiculopathy 02/09/2018  . Lumbar degenerative disc disease 02/09/2018  . DDD (degenerative disc disease), cervical 02/09/2018  . Cervical radiculopathy 02/09/2018  . Cervicalgia 02/09/2018      Prior to Admission medications   Medication Sig Start Date End Date Taking? Authorizing Provider  Biotin 10 MG TABS Take 500 mg by mouth daily.    [provider]  calcium-vitamin D (OSCAL WITH D) 500-200 MG-UNIT tablet Take 1 tablet by mouth 2 (two) times daily.    [provider]  cycloSPORINE (RESTASIS) 0.05 % ophthalmic emulsion 1 drop 2 (two) times daily.    [provider]  diazepam (VALIUM) 10 MG tablet Take 10 mg by mouth 3 (three) times daily.    [provider]  estrogens, conjugated, (PREMARIN) 0.625 MG tablet Take 0.625 mg by mouth daily. Take daily  for 21 days then do not take for 7 days.     [provider]  estrogens, conjugated, (PREMARIN) 0.625 MG tablet Take by mouth. 02/13/16   [provider]  fluticasone (FLONASE) 50 MCG/ACT nasal spray Place into the nose. 10/25/14   [provider]  meloxicam (MOBIC) 15 MG tablet Take 15 mg by mouth as needed.     [provider]  mirtazapine (REMERON) 15 MG tablet Take 15 mg by mouth at bedtime as needed.    [provider]  mirtazapine (REMERON) 30 MG tablet  02/19/16   [provider]  morphine (KADIAN) 60 MG 24 hr capsule Take 60 mg by mouth 2 (two) times daily.    [provider]  Multiple Vitamins-Minerals (ALIVE ONCE DAILY WOMENS 50+ PO) Take 1 tablet by mouth daily.    [provider]  naloxegol oxalate (MOVANTIK) 12.5 MG TABS tablet Take 12.5 mg by mouth daily.    [provider]  nicotine (NICOTROL) 10 MG inhaler  11/30/14   [provider]  nortriptyline (PAMELOR) 10 MG capsule Take 2 capsules by mouth at bedtime. 12/29/17   [provider]  pantoprazole (PROTONIX) 40 MG tablet Take 40 mg by mouth daily.    [provider]  polyethylene glycol (MIRALAX / GLYCOLAX) packet Take 17 g by mouth daily.    [provider]  prochlorperazine (COMPAZINE) 10 MG tablet Take 1 tablet (10 mg total) by mouth every 6 (six) hours as needed for nausea or vomiting. 08/05/18   Merlyn Lot, MD  ranitidine (ZANTAC) 300 MG capsule Take 300 mg by mouth every evening.    [provider]  senna (SENOKOT) 8.6 MG tablet Take 2 tablets by mouth daily.    [provider]  senna (SENOKOT) 8.6 MG tablet  01/10/16   [provider]  tiZANidine (ZANAFLEX) 4 MG capsule Take 4 mg by mouth 3 (three) times daily.    [provider]  tiZANidine (ZANAFLEX) 4 MG tablet  02/13/16   [provider]  traMADol (ULTRAM) 50 MG tablet Take 50 mg by mouth every 8 (eight) hours as needed.    [provider]    Allergies Gadolinium derivatives; Latuda [lurasidone hcl]; Shellfish allergy; Abilify [aripiprazole]; Ambien [zolpidem]; Lurasidone; Neurontin [gabapentin]; Sulfa antibiotics; and Wellbutrin [bupropion]    Social History Social History   Tobacco Use  . Smoking status: Current Every Day Smoker    Packs/day: 0.50    Years: 30.00    Pack years: 15.00    Types: Cigarettes  . Smokeless tobacco: Never Used  Substance Use Topics  . Alcohol use: No    Alcohol/week: 0.0 standard drinks  . Drug use: No    Review of Systems Patient denies headaches, rhinorrhea, blurry vision, numbness,  shortness of breath, chest pain, edema, cough, abdominal pain, nausea, vomiting, diarrhea, dysuria, fevers, rashes or hallucinations unless otherwise stated above in HPI. ____________________________________________   PHYSICAL EXAM:  VITAL SIGNS: Vitals:   08/05/18 2200 08/05/18 2230  BP: 119/72 (!) 117/56  Pulse: 75 77  Resp: 17 18  Temp:    SpO2: 92% 95%    Constitutional: Alert and oriented.  Eyes: Conjunctivae are normal.  Head: Atraumatic. Nose: No congestion/rhinnorhea. Mouth/Throat: Mucous membranes are moist.   Neck: No stridor. Painless ROM.  Cardiovascular: Normal rate, regular rhythm. Grossly normal heart sounds.  Good peripheral circulation. Respiratory: Normal respiratory effort.  No retractions. Lungs CTAB. Gastrointestinal: Soft and nontender. No distention. No abdominal bruits.  Left CVA tenderness. Genitourinary:  Musculoskeletal: No lower extremity tenderness nor edema.  No joint effusions. Neurologic:  Normal speech and language. No gross focal neurologic deficits are appreciated. No facial droop Skin:  Skin is warm, dry and intact. No rash noted. Psychiatric: Mood and affect are normal. Speech and behavior are normal.  ____________________________________________   LABS (all labs ordered are listed, but only abnormal results are displayed)  Results for orders placed or performed during the hospital encounter of 08/05/18 (from the past 24 hour(s))  Lipase, blood     Status: None   Collection Time: 08/05/18  8:03 PM  Result Value Ref Range   Lipase 30 11 - 51 U/L  Comprehensive metabolic panel     Status: Abnormal   Collection Time: 08/05/18  8:03 PM  Result Value Ref Range   Sodium 139 135 - 145 mmol/L   Potassium 4.1 3.5 - 5.1 mmol/L   Chloride 103 98 - 111 mmol/L   CO2 24 22 - 32 mmol/L   Glucose, Bld 128 (H) 70 - 99 mg/dL   BUN 10 6 - 20 mg/dL   Creatinine, Ser 0.62 0.44 - 1.00 mg/dL   Calcium 9.7 8.9 - 10.3 mg/dL   Total Protein 7.5 6.5 - 8.1  g/dL   Albumin 4.2 3.5 - 5.0 g/dL   AST 26 15 - 41 U/L   ALT 20 0 - 44 U/L   Alkaline Phosphatase 100 38 - 126 U/L   Total Bilirubin 0.7 0.3 - 1.2 mg/dL   GFR calc non Af Amer >60 >60 mL/min   GFR calc Af Amer >60 >60 mL/min   Anion gap 12 5 - 15  Urinalysis, Complete w Microscopic     Status: Abnormal   Collection Time: 08/05/18  8:03 PM  Result Value Ref Range   Color, Urine AMBER (A) YELLOW   APPearance CLEAR (A) CLEAR   Specific Gravity, Urine 1.014 1.005 - 1.030   pH 5.0 5.0 - 8.0   Glucose, UA NEGATIVE NEGATIVE mg/dL   Hgb urine dipstick NEGATIVE NEGATIVE   Bilirubin Urine NEGATIVE NEGATIVE   Ketones, ur NEGATIVE NEGATIVE mg/dL   Protein, ur NEGATIVE NEGATIVE mg/dL   Nitrite NEGATIVE NEGATIVE   Leukocytes, UA NEGATIVE NEGATIVE   RBC / HPF 0-5 0 - 5 RBC/hpf   WBC, UA 0-5 0 - 5 WBC/hpf   Bacteria, UA NONE SEEN NONE SEEN   Squamous Epithelial / LPF 0-5 0 - 5   Mucus PRESENT    Hyaline Casts, UA PRESENT   CBC     Status: Abnormal   Collection Time: 08/05/18  9:02 PM  Result Value Ref Range   WBC 11.6 (H) 3.6 - 11.0 K/uL   RBC 4.73 3.80 - 5.20 MIL/uL   Hemoglobin 14.2 12.0 - 16.0 g/dL   HCT 41.4 35.0 - 47.0 %   MCV 87.5 80.0 - 100.0 fL   MCH 30.0 26.0 - 34.0 pg   MCHC 34.2 32.0 - 36.0 g/dL   RDW 13.9 11.5 - 14.5 %   Platelets 323 150 - 440 K/uL  Troponin I     Status: None   Collection Time: 08/05/18  9:02 PM  Result Value Ref Range   Troponin I <0.03 <0.03 ng/mL   ____________________________________________  EKG My review and personal interpretation at Time: 21:53   Indication: flank pain  Rate: 65  Rhythm: sinus Axis: normal Other: normal intervals, no stemi ____________________________________________  RADIOLOGY  I personally reviewed all radiographic images ordered to  evaluate for the above acute complaints and reviewed radiology reports and findings.  These findings were personally discussed with the patient.  Please see medical record for radiology  report.  ____________________________________________   PROCEDURES  Procedure(s) performed:  Procedures    Critical Care performed: no ____________________________________________   INITIAL IMPRESSION / ASSESSMENT AND PLAN / ED COURSE  Pertinent labs & imaging results that were available during my care of the patient were reviewed by me and considered in my medical decision making (see chart for details).   DDX: stone, pyelo, msk strain, acs, migraine, tension, unlikely sah, meningitis, dehydration  Corneshia Hines is a 52 y.o. who presents to the ED with symptoms as described above.  Patient uncomfortable appearing but nontoxic.  She is afebrile hemodynamically stable.  No respiratory distress.  Based on her symptoms with history of stone will evaluate for recurrent stone with CT.  Possible enteritis.  Seems less likely to be SBO but also possibility given her previous history of abdominal surgeries.  Neuro exam is nonfocal.  She has no meningismus.  Do suspect this is secondary to recurrent nausea vomiting and possible dehydration.  Patient does have a history of headaches and this is not the worst headache of her life.  Not consistent with subarachnoid or infectious etiology.  Provide IV hydration and IV antiemetic and pain medication.  Will reassess.  Clinical Course as of Aug 05 2245  Wed Aug 05, 2018  2225 Patient reassessed.  Pain is improved.  No evidence of infectious process at this time.  Will send urine for culture.  No evidence of stone.  No evidence of pancreatitis.  Unlikely ACS.  Patient tolerating oral hydration.  Possible gastroenteritis.  Patient stable and appropriate for outpatient follow-up.   [PR]  2245 Patient was able to tolerate PO and was able to ambulate with a steady gait.  Have discussed with the patient and available family all diagnostics and treatments performed thus far and all questions were answered to the best of my ability. The patient  demonstrates understanding and agreement with plan.     [PR]    Clinical Course User Index [PR] Merlyn Lot, MD     As part of my medical decision making, I reviewed the following data within the [place] notes reviewed and incorporated, Labs reviewed, notes from prior ED visits.   ____________________________________________   FINAL CLINICAL IMPRESSION(S) / ED DIAGNOSES  Final diagnoses:  Non-intractable vomiting with nausea, unspecified vomiting type  Bad headache      NEW MEDICATIONS STARTED DURING THIS VISIT:  New Prescriptions   PROCHLORPERAZINE (COMPAZINE) 10 MG TABLET    Take 1 tablet (10 mg total) by mouth every 6 (six) hours as needed for nausea or vomiting.     Note:  This document was prepared using Dragon voice recognition software and may include unintentional dictation errors.    Merlyn Lot, MD 08/05/18 [phone]

## 2018-09-14 ENCOUNTER — Other Ambulatory Visit: Payer: Self-pay | Admitting: Internal Medicine

## 2019-01-29 ENCOUNTER — Other Ambulatory Visit: Payer: Self-pay

## 2019-01-29 ENCOUNTER — Emergency Department
Admission: EM | Admit: 2019-01-29 | Discharge: 2019-01-30 | Disposition: A | Discharge status: Home / Self Care | Payer: Medicare Other | Attending: Emergency Medicine | Admitting: Emergency Medicine

## 2019-01-29 DIAGNOSIS — Z79899 Other long term (current) drug therapy: Secondary | ICD-10-CM | POA: Insufficient documentation

## 2019-01-29 DIAGNOSIS — M545 Low back pain, unspecified: Secondary | ICD-10-CM

## 2019-01-29 DIAGNOSIS — G8929 Other chronic pain: Secondary | ICD-10-CM

## 2019-01-29 DIAGNOSIS — F17210 Nicotine dependence, cigarettes, uncomplicated: Secondary | ICD-10-CM | POA: Diagnosis not present

## 2019-01-29 NOTE — ED Notes (Signed)
Pt states that for the past 4 months her back has been in pain and her hands, legs and feet go numb. Pt has Hx of 3 back surgeries in the past. Pt reports extensive back pain and discomfort that is gradually getting worse. Pt is wearing a back brace at this time.

## 2019-01-29 NOTE — ED Triage Notes (Addendum)
Patient reports fell several weeks ago because she lost her balance and landed flat on her back.  Not seen after fall  Report seen at surgeron's office earlier today and told she has a "large knot" in her back.  Patient reports since being home her back has been bothering her and medications not working.   Patient reports she has a burning sensation down her legs.

## 2019-01-30 ENCOUNTER — Emergency Department: Payer: Medicare Other

## 2019-01-30 DIAGNOSIS — M545 Low back pain: Secondary | ICD-10-CM | POA: Diagnosis not present

## 2019-01-30 MED ORDER — KETOROLAC TROMETHAMINE 30 MG/ML IJ SOLN
10.0000 mg | Freq: Once | INTRAMUSCULAR | Status: AC
  Administered 2019-01-30: 9.9 mg via INTRAVENOUS
  Filled 2019-01-30: qty 1

## 2019-01-30 MED ORDER — ONDANSETRON HCL 4 MG/2ML IJ SOLN
4.0000 mg | Freq: Once | INTRAMUSCULAR | Status: AC
  Administered 2019-01-30: 4 mg via INTRAVENOUS
  Filled 2019-01-30: qty 2

## 2019-01-30 MED ORDER — DIAZEPAM 5 MG PO TABS
5.0000 mg | ORAL_TABLET | Freq: Once | ORAL | Status: AC
  Administered 2019-01-30: 5 mg via ORAL
  Filled 2019-01-30: qty 1

## 2019-01-30 MED ORDER — MORPHINE SULFATE (PF) 4 MG/ML IV SOLN
4.0000 mg | Freq: Once | INTRAVENOUS | Status: AC
  Administered 2019-01-30: 4 mg via INTRAVENOUS
  Filled 2019-01-30: qty 1

## 2019-01-30 NOTE — Discharge Instructions (Addendum)
Continue the medicines prescribed to you by your doctor.  Return to the ER for worsening symptoms, persistent vomiting, difficulty breathing, losing control of your bowel or bladder, or other concerns.

## 2019-01-30 NOTE — ED Provider Notes (Signed)
[facility] Emergency Department Provider Note   ____________________________________________   First MD Initiated Contact with Patient 01/30/19 0004     (approximate)  I have reviewed the triage vital signs and the nursing notes.   HISTORY  Chief Complaint Back Pain    HPI Megan Roth is a 53 y.o. female who presents to the ED from home with a chief complaint of acute on chronic lower back pain.  Patient has a history of 3 lumbar surgeries.  States she fell 2 weeks ago and landed flat on her back.  She was seen at her back surgeons office this afternoon and given a Toradol shot and prescribed gabapentin.  She was told she has a "knot" in her back.  She was told she would need to be scheduled for a MRI.  Presents tonight because her pain was uncontrolled with her Nucynta at home.  States she has chronic numbness and tingling in her hands and burning sensation down her right leg.  Denies extremity weakness, bowel or bladder incontinence.  Denies recent fever, chills, chest pain, shortness of breath, abdominal pain, nausea or vomiting.  Denies use of anticoagulants.  Saw her pain doctor on 2/4 for the following:  1. Pain syndrome, chronic  2. Chronic midline thoracic back pain  3. Lumbosacral radiculopathy  4. Cervical radiculopathy  5. Bilateral hand numbness  6. Numbness of foot  7. Numbness of feet  8. Muscle spasm  9. Chronic right-sided low back pain with right-sided sciatica  10. Radicular pain  11. Spasticity     Past Medical History:  Diagnosis Date  . Back pain   . Chronic constipation   . Degenerative disc disease, lumbar   . Depression   . Fatigue   . Gallbladder calculus   . Generalized anxiety disorder   . GERD (gastroesophageal reflux disease)   . History of kidney stones   . Hypercholesteremia   . IDA (iron deficiency anemia)   . Migraine   . Numbness in feet   . Ovarian cyst   . Pain syndrome, chronic   .  Scoliosis   . Seasonal allergies   . Spasticity   . Tension headache   . Tobacco abuse     Patient Active Problem List   Diagnosis Date Noted  . 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 History of lumbar surgery 02/09/2018  . Lumbar radiculopathy 02/09/2018  . Lumbar degenerative disc disease 02/09/2018  . DDD (degenerative disc disease), cervical 02/09/2018  . Cervical radiculopathy 02/09/2018  . Cervicalgia 02/09/2018    Past Surgical History:  Procedure Laterality Date  . ABDOMINAL HYSTERECTOMY     TAHBSO  . BREAST BIOPSY Left 07/24/2017   stereotactic biopsy  . CHOLECYSTECTOMY    . COLONOSCOPY WITH PROPOFOL N/A 01/05/2018   Procedure: COLONOSCOPY WITH PROPOFOL;  Surgeon: Lollie Sails, MD;  Location: [facility] ENDOSCOPY;  Service: Endoscopy;  Laterality: N/A;  . ESOPHAGOGASTRODUODENOSCOPY (EGD) WITH PROPOFOL N/A 01/05/2018   Procedure: ESOPHAGOGASTRODUODENOSCOPY (EGD) WITH PROPOFOL;  Surgeon: Lollie Sails, MD;  Location: [facility] ENDOSCOPY;  Service: Endoscopy;  Laterality: N/A;  . LAMINECTOMY     x 3 surgeries  . LAPAROSCOPIC OVARIAN CYSTECTOMY    . MANDIBLE FRACTURE SURGERY      Prior to Admission medications   Medication Sig Start Date End Date Taking? Authorizing Provider  Biotin 10 MG TABS Take 500 mg by mouth daily.    [provider]  calcium-vitamin D (OSCAL WITH D) 500-200 MG-UNIT tablet  Take 1 tablet by mouth 2 (two) times daily.    [provider]  cycloSPORINE (RESTASIS) 0.05 % ophthalmic emulsion 1 drop 2 (two) times daily.    [provider]  diazepam (VALIUM) 10 MG tablet Take 10 mg by mouth 3 (three) times daily.    [provider]  estrogens, conjugated, (PREMARIN) 0.625 MG tablet Take 0.625 mg by mouth daily. Take daily for 21 days then do not take for 7 days.     [provider]  estrogens, conjugated, (PREMARIN) 0.625 MG tablet Take by mouth. 02/13/16   [provider]  fluticasone (FLONASE) 50  MCG/ACT nasal spray Place into the nose. 10/25/14   [provider]  meloxicam (MOBIC) 15 MG tablet Take 15 mg by mouth as needed.     [provider]  mirtazapine (REMERON) 15 MG tablet Take 15 mg by mouth at bedtime as needed.    [provider]  mirtazapine (REMERON) 30 MG tablet  02/19/16   [provider]  morphine (KADIAN) 60 MG 24 hr capsule Take 60 mg by mouth 2 (two) times daily.    [provider]  Multiple Vitamins-Minerals (ALIVE ONCE DAILY WOMENS 50+ PO) Take 1 tablet by mouth daily.    [provider]  naloxegol oxalate (MOVANTIK) 12.5 MG TABS tablet Take 12.5 mg by mouth daily.    [provider]  nicotine (NICOTROL) 10 MG inhaler  11/30/14   [provider]  nortriptyline (PAMELOR) 10 MG capsule Take 2 capsules by mouth at bedtime. 12/29/17   [provider]  pantoprazole (PROTONIX) 40 MG tablet Take 40 mg by mouth daily.    [provider]  polyethylene glycol (MIRALAX / GLYCOLAX) packet Take 17 g by mouth daily.    [provider]  prochlorperazine (COMPAZINE) 10 MG tablet Take 1 tablet (10 mg total) by mouth every 6 (six) hours as needed for nausea or vomiting. 08/05/18   Merlyn Lot, MD  ranitidine (ZANTAC) 300 MG capsule Take 300 mg by mouth every evening.    [provider]  senna (SENOKOT) 8.6 MG tablet Take 2 tablets by mouth daily.    [provider]  senna (SENOKOT) 8.6 MG tablet  01/10/16   [provider]  tiZANidine (ZANAFLEX) 4 MG capsule Take 4 mg by mouth 3 (three) times daily.    [provider]  tiZANidine (ZANAFLEX) 4 MG tablet  02/13/16   [provider]  traMADol (ULTRAM) 50 MG tablet Take 50 mg by mouth every 8 (eight) hours as needed.    [provider]    Allergies Gadolinium derivatives; Latuda [lurasidone hcl]; Shellfish allergy; Abilify [aripiprazole]; Ambien [zolpidem]; Lurasidone; Neurontin  [gabapentin]; Sulfa antibiotics; and Wellbutrin [bupropion]  Family History  Problem Relation Age of Onset  . Arthritis Unknown   . Diabetes Mellitus II Unknown   . Heart disease Unknown   . Hypertension Unknown   . Kidney disease Unknown   . Stroke Unknown   . Breast cancer Neg Hx     Social History Social History   Tobacco Use  . Smoking status: Current Every Day Smoker    Packs/day: 0.50    Years: 30.00    Pack years: 15.00    Types: Cigarettes  . Smokeless tobacco: Never Used  Substance Use Topics  . Alcohol use: No    Alcohol/week: 0.0 standard drinks  . Drug use: No    Review of Systems  Constitutional: No fever/chills Eyes: No visual changes.  ENT: No sore throat. Cardiovascular: Denies chest pain. Respiratory: Denies shortness of breath. Gastrointestinal: No abdominal pain.  No nausea, no vomiting.  No diarrhea.  No constipation. Genitourinary: Negative for dysuria. Musculoskeletal: Positive for back pain. Skin: Negative for rash. Neurological: Negative for headaches, focal weakness or numbness.   ____________________________________________   PHYSICAL EXAM:  VITAL SIGNS: ED Triage Vitals [01/29/19 2040]  Enc Vitals Group     BP (!) 172/102     Pulse Rate 96     Resp 20     Temp 98.5 F (36.9 C)     Temp Source Oral     SpO2 96 %     Weight 155 lb (70.3 kg)     Height 5\' 3"  (1.6 m)     Head Circumference      Peak Flow      Pain Score 9     Pain Loc      Pain Edu?      Excl. in [place]?     Constitutional: Alert and oriented. Well appearing and in mild acute distress. Eyes: Conjunctivae are normal. PERRL. EOMI. Head: Atraumatic. Nose: No congestion/rhinnorhea. Mouth/Throat: Mucous membranes are moist.  Oropharynx non-erythematous. Neck: No stridor.  No cervical spine tenderness to palpation. Cardiovascular: Normal rate, regular rhythm. Grossly normal heart sounds.  Good peripheral circulation. Respiratory: Normal respiratory effort.  No  retractions. Lungs CTAB. Gastrointestinal: Soft and nontender. No distention. No abdominal bruits. No CVA tenderness. Musculoskeletal: Back brace removed for exam.  Lumbar spine tender to palpation.  Left paraspinal muscle spasm.  No lower extremity tenderness nor edema.  No joint effusions. Neurologic:  Normal speech and language. No gross focal neurologic deficits are appreciated.  Skin:  Skin is warm, dry and intact. No rash noted. Psychiatric: Mood and affect are normal. Speech and behavior are normal.  ____________________________________________   LABS (all labs ordered are listed, but only abnormal results are displayed)  Labs Reviewed - No data to display ____________________________________________  EKG  None ____________________________________________  RADIOLOGY  ED MD interpretation: Stable MRI from prior; no acute  Official radiology report(s): Mr Lumbar Spine Wo Contrast  Result Date: 01/30/2019 CLINICAL DATA:  Initial evaluation for chronic low back pain with bilateral leg numbness. EXAM: MRI LUMBAR SPINE WITHOUT CONTRAST TECHNIQUE: Multiplanar, multisequence MR imaging of the lumbar spine was performed. No intravenous contrast was administered. COMPARISON:  Previous MRI from 06/05/2018 FINDINGS: Segmentation: Standard. Lowest well-formed disc labeled the L5-S1 level. Alignment: Physiologic with preservation of the normal lumbar lordosis. No listhesis. Vertebrae: Susceptibility artifact from prior PLIF at L4-5 and L5-S1. Vertebral body heights maintained without evidence for acute or chronic fracture. Bone marrow signal intensity normal. No discrete or worrisome osseous lesions. No abnormal marrow edema. Conus medullaris and cauda equina: Conus extends to the L1 level. Conus and cauda equina appear normal. Paraspinal and other soft tissues: Paraspinous soft tissues demonstrate no acute finding. Visualized visceral structures unremarkable. Disc levels: L1-2:  Unremarkable.  L2-3: Mild disc bulge with disc desiccation. Mild bilateral facet hypertrophy. No stenosis or impingement. Appearance is stable from previous. L3-4: Negative interspace. Mild bilateral facet hypertrophy. No stenosis or impingement. Appearance is stable from previous. L4-5:  Prior PLIF.  No residual canal or foraminal stenosis. L5-S1:  Prior PLIF.  No residual canal or foraminal stenosis. IMPRESSION: 1. Stable MRI appearance of the lumbar spine as compared to 2019. 2. Previous PLIF at L4-5 and L5-S1 without residual or recurrent stenosis. 3. Mild bilateral facet hypertrophy at L2-3 and L3-4 without  stenosis or impingement. Electronically Signed   By: Jeannine Boga M.D.   On: 01/30/2019 02:14    ____________________________________________   PROCEDURES  Procedure(s) performed (including Critical Care):  Procedures   ____________________________________________   INITIAL IMPRESSION / ASSESSMENT AND PLAN / ED COURSE  As part of my medical decision making, I reviewed the following data within the [place] notes reviewed and incorporated, Old chart reviewed, Radiograph reviewed and Notes from prior ED visits    53 year old female who presents with acute on chronic low back pain.  Radiculopathy symptoms at baseline.  Patient is requesting MRI.  I discussed with patient it does not sound like her symptoms are new or acute, and her back surgeon would have sent her for urgent MRI if he thought she needed one.  However, patient is practically begging me for an MRI tonight as she is concerned her back is hurting worse.  Will administer IV analgesia paired with NSAIDs and muscle relaxer.  Will obtain MRI of lumbar spine.   Clinical Course as of Feb 29 0307  Sat Jan 30, 2019  0254 Updated patient of MRI results.  Strict return precautions given.  Patient verbalizes understanding agrees with plan of care.   [JS]    Clinical Course User Index [JS] Paulette Blanch, MD      ____________________________________________   FINAL CLINICAL IMPRESSION(S) / ED DIAGNOSES  Final diagnoses:  Chronic left-sided low back pain without sciatica     ED Discharge Orders    None       Note:  This document was prepared using Dragon voice recognition software and may include unintentional dictation errors.   Paulette Blanch, MD 01/30/19 [phone]

## 2019-01-31 ENCOUNTER — Encounter: Payer: Self-pay | Admitting: Emergency Medicine

## 2019-01-31 DIAGNOSIS — F419 Anxiety disorder, unspecified: Secondary | ICD-10-CM | POA: Insufficient documentation

## 2019-01-31 DIAGNOSIS — M5440 Lumbago with sciatica, unspecified side: Secondary | ICD-10-CM | POA: Diagnosis not present

## 2019-01-31 DIAGNOSIS — M419 Scoliosis, unspecified: Secondary | ICD-10-CM | POA: Diagnosis not present

## 2019-01-31 DIAGNOSIS — F329 Major depressive disorder, single episode, unspecified: Secondary | ICD-10-CM | POA: Diagnosis not present

## 2019-01-31 DIAGNOSIS — Z79899 Other long term (current) drug therapy: Secondary | ICD-10-CM | POA: Diagnosis not present

## 2019-01-31 DIAGNOSIS — F17210 Nicotine dependence, cigarettes, uncomplicated: Secondary | ICD-10-CM | POA: Diagnosis not present

## 2019-01-31 DIAGNOSIS — Z9049 Acquired absence of other specified parts of digestive tract: Secondary | ICD-10-CM | POA: Insufficient documentation

## 2019-01-31 DIAGNOSIS — M545 Low back pain: Secondary | ICD-10-CM | POA: Diagnosis present

## 2019-01-31 NOTE — ED Triage Notes (Signed)
Pt c/o lower back pain, chronic. Pt seen on Friday, had MRI performed and reported that pt needed to contact surgeon. Pt hx/o 3 back surgeries as well as multiple injections. Pt to ED tonight due to worsening pain and pt reports she hasn't been able to hold her urine "all the time."

## 2019-02-01 ENCOUNTER — Other Ambulatory Visit (HOSPITAL_COMMUNITY): Payer: Self-pay | Admitting: Orthopaedic Surgery

## 2019-02-01 ENCOUNTER — Emergency Department
Admission: EM | Admit: 2019-02-01 | Discharge: 2019-02-01 | Disposition: A | Discharge status: Home / Self Care | Payer: Medicare Other | Attending: Emergency Medicine | Admitting: Emergency Medicine

## 2019-02-01 DIAGNOSIS — M47892 Other spondylosis, cervical region: Secondary | ICD-10-CM

## 2019-02-01 DIAGNOSIS — M5440 Lumbago with sciatica, unspecified side: Secondary | ICD-10-CM | POA: Diagnosis not present

## 2019-02-01 DIAGNOSIS — M542 Cervicalgia: Secondary | ICD-10-CM

## 2019-02-01 LAB — URINALYSIS, COMPLETE (UACMP) WITH MICROSCOPIC
BILIRUBIN URINE: NEGATIVE
GLUCOSE, UA: NEGATIVE mg/dL
Hgb urine dipstick: NEGATIVE
KETONES UR: NEGATIVE mg/dL
Leukocytes,Ua: NEGATIVE
Nitrite: NEGATIVE
PH: 5 (ref 5.0–8.0)
Protein, ur: NEGATIVE mg/dL
SPECIFIC GRAVITY, URINE: 1.013 (ref 1.005–1.030)

## 2019-02-01 MED ORDER — KETOROLAC TROMETHAMINE 30 MG/ML IJ SOLN
30.0000 mg | Freq: Once | INTRAMUSCULAR | Status: AC
  Administered 2019-02-01: 30 mg via INTRAVENOUS
  Filled 2019-02-01: qty 1

## 2019-02-01 NOTE — ED Provider Notes (Signed)
[facility] Emergency Department Provider Note    First MD Initiated Contact with Patient 02/01/19 0216     (approximate)  I have reviewed the triage vital signs and the nursing notes.   HISTORY  Chief Complaint Back Pain    HPI Megan Roth is a 53 y.o. female with below list of chronic medical conditions including "3 spinal surgeries" with recent emergency department visit secondary to  acute on chronic low back pain.  Patient states that she fell 2 weeks ago landing flat on her back which is what prompted her visit to the emergency department on 01/30/2019.  Patient was seen by her "back surgeon before that appointment and prescribed gabapentin which she states that she has not been taking because she was informed by an RN that it could cause suicidal thoughts".  Patient presents tonight stating that she has had episodes of urinary incontinence and continued low back discomfort.  Patient denies any bowel habit changes.  Patient denies any fever.  Patient denies any lower extremity weakness or numbness.       Past Medical History:  Diagnosis Date  . Back pain   . Chronic constipation   . Degenerative disc disease, lumbar   . Depression   . Fatigue   . Gallbladder calculus   . Generalized anxiety disorder   . GERD (gastroesophageal reflux disease)   . History of kidney stones   . Hypercholesteremia   . IDA (iron deficiency anemia)   . Migraine   . Numbness in feet   . Ovarian cyst   . Pain syndrome, chronic   . Scoliosis   . Seasonal allergies   . Spasticity   . Tension headache   . Tobacco abuse     Patient Active Problem List   Diagnosis Date Noted  . 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 History of lumbar surgery 02/09/2018  . Lumbar radiculopathy 02/09/2018  . Lumbar degenerative disc disease 02/09/2018  . DDD (degenerative disc disease), cervical 02/09/2018  . Cervical radiculopathy 02/09/2018  . Cervicalgia  02/09/2018    Past Surgical History:  Procedure Laterality Date  . ABDOMINAL HYSTERECTOMY     TAHBSO  . BREAST BIOPSY Left 07/24/2017   stereotactic biopsy  . CHOLECYSTECTOMY    . COLONOSCOPY WITH PROPOFOL N/A 01/05/2018   Procedure: COLONOSCOPY WITH PROPOFOL;  Surgeon: Lollie Sails, MD;  Location: [facility] ENDOSCOPY;  Service: Endoscopy;  Laterality: N/A;  . ESOPHAGOGASTRODUODENOSCOPY (EGD) WITH PROPOFOL N/A 01/05/2018   Procedure: ESOPHAGOGASTRODUODENOSCOPY (EGD) WITH PROPOFOL;  Surgeon: Lollie Sails, MD;  Location: [facility] ENDOSCOPY;  Service: Endoscopy;  Laterality: N/A;  . LAMINECTOMY     x 3 surgeries  . LAPAROSCOPIC OVARIAN CYSTECTOMY    . MANDIBLE FRACTURE SURGERY      Prior to Admission medications   Medication Sig Start Date End Date Taking? Authorizing Provider  Biotin 10 MG TABS Take 500 mg by mouth daily.    [provider]  calcium-vitamin D (OSCAL WITH D) 500-200 MG-UNIT tablet Take 1 tablet by mouth 2 (two) times daily.    [provider]  cycloSPORINE (RESTASIS) 0.05 % ophthalmic emulsion 1 drop 2 (two) times daily.    [provider]  diazepam (VALIUM) 10 MG tablet Take 10 mg by mouth 3 (three) times daily.    [provider]  estrogens, conjugated, (PREMARIN) 0.625 MG tablet Take 0.625 mg by mouth daily. Take daily for 21 days then do not take for 7 days.  [provider]  estrogens, conjugated, (PREMARIN) 0.625 MG tablet Take by mouth. 02/13/16   [provider]  fluticasone (FLONASE) 50 MCG/ACT nasal spray Place into the nose. 10/25/14   [provider]  meloxicam (MOBIC) 15 MG tablet Take 15 mg by mouth as needed.     [provider]  mirtazapine (REMERON) 15 MG tablet Take 15 mg by mouth at bedtime as needed.    [provider]  mirtazapine (REMERON) 30 MG tablet  02/19/16   [provider]  morphine (KADIAN) 60 MG 24 hr capsule Take 60 mg by mouth 2 (two) times daily.     [provider]  Multiple Vitamins-Minerals (ALIVE ONCE DAILY WOMENS 50+ PO) Take 1 tablet by mouth daily.    [provider]  naloxegol oxalate (MOVANTIK) 12.5 MG TABS tablet Take 12.5 mg by mouth daily.    [provider]  nicotine (NICOTROL) 10 MG inhaler  11/30/14   [provider]  nortriptyline (PAMELOR) 10 MG capsule Take 2 capsules by mouth at bedtime. 12/29/17   [provider]  pantoprazole (PROTONIX) 40 MG tablet Take 40 mg by mouth daily.    [provider]  polyethylene glycol (MIRALAX / GLYCOLAX) packet Take 17 g by mouth daily.    [provider]  prochlorperazine (COMPAZINE) 10 MG tablet Take 1 tablet (10 mg total) by mouth every 6 (six) hours as needed for nausea or vomiting. 08/05/18   Merlyn Lot, MD  ranitidine (ZANTAC) 300 MG capsule Take 300 mg by mouth every evening.    [provider]  senna (SENOKOT) 8.6 MG tablet Take 2 tablets by mouth daily.    [provider]  senna (SENOKOT) 8.6 MG tablet  01/10/16   [provider]  tiZANidine (ZANAFLEX) 4 MG capsule Take 4 mg by mouth 3 (three) times daily.    [provider]  tiZANidine (ZANAFLEX) 4 MG tablet  02/13/16   [provider]  traMADol (ULTRAM) 50 MG tablet Take 50 mg by mouth every 8 (eight) hours as needed.    [provider]    Allergies Gadolinium derivatives; Latuda [lurasidone hcl]; Shellfish allergy; Abilify [aripiprazole]; Ambien [zolpidem]; Lurasidone; Neurontin [gabapentin]; Sulfa antibiotics; and Wellbutrin [bupropion]  Family History  Problem Relation Age of Onset  . Arthritis Other   . Diabetes Mellitus II Other   . Heart disease Other   . Hypertension Other   . Kidney disease Other   . Stroke Other   . Breast cancer Neg Hx     Social History Social History   Tobacco Use  . Smoking status: Current Every Day Smoker    Packs/day: 0.50    Years: 30.00    Pack years: 15.00     Types: Cigarettes  . Smokeless tobacco: Never Used  Substance Use Topics  . Alcohol use: No    Alcohol/week: 0.0 standard drinks  . Drug use: No    Review of Systems Constitutional: No fever/chills Eyes: No visual changes. ENT: No sore throat. Cardiovascular: Denies chest pain. Respiratory: Denies shortness of breath. Gastrointestinal: No abdominal pain.  No nausea, no vomiting.  No diarrhea.  No constipation. Genitourinary: Negative for dysuria.  Positive for urinary incontinence Musculoskeletal: Negative for neck pain.  Positive for back pain. Integumentary: Negative for rash. Neurological: Negative for headaches, focal weakness or numbness.  ____________________________________________   PHYSICAL EXAM:  VITAL SIGNS: ED Triage Vitals [01/31/19 2242]  Enc Vitals Group     BP (!) 146/88  Pulse Rate 92     Resp 20     Temp 98.6 F (37 C)     Temp Source Oral     SpO2 98 %     Weight      Height      Head Circumference      Peak Flow      Pain Score      Pain Loc      Pain Edu?      Excl. in [place]?     Constitutional: Alert and oriented. Well appearing and in no acute distress. Eyes: Conjunctivae are normal.  Mouth/Throat: Mucous membranes are moist. Oropharynx non-erythematous. Neck: No stridor.   Cardiovascular: Normal rate, regular rhythm. Good peripheral circulation. Grossly normal heart sounds. Respiratory: Normal respiratory effort.  No retractions. Lungs CTAB. Gastrointestinal: Soft and nontender. No distention.  Musculoskeletal: No lower extremity tenderness nor edema. No gross deformities of extremities. Neurologic:  Normal speech and language. No gross focal neurologic deficits are appreciated.  Skin:  Skin is warm, dry and intact. No rash noted. Psychiatric: Mood and affect are normal. Speech and behavior are normal.  ____________________________________________   LABS (all labs ordered are listed, but only abnormal results are displayed)  Labs  Reviewed  URINALYSIS, COMPLETE (UACMP) WITH MICROSCOPIC - Abnormal; Notable for the following components:      Result Value   Color, Urine YELLOW (*)    APPearance CLEAR (*)    Bacteria, UA RARE (*)    All other components within normal limits      Procedures   ____________________________________________   INITIAL IMPRESSION / MDM / ASSESSMENT AND PLAN / ED COURSE  As part of my medical decision making, I reviewed the following data within the electronic MEDICAL RECORD NUMBER   53 year old female presented with above-stated history and physical exam secondary to chronic low back pain with noted urinary incontinence which she states been occurring "all the time".  Patient had an MRI performed of the lumbar spine here in the emergency department on 01/30/2019 which revealed no acute findings.  Patient without any neurological deficits at this time.  Urinalysis unremarkable.  Patient advised to take gabapentin as prescribed and follow-up with her spine surgeon.     ________________________________________  FINAL CLINICAL IMPRESSION(S) / ED DIAGNOSES  Final diagnoses:  Acute bilateral low back pain with sciatica, sciatica laterality unspecified     MEDICATIONS GIVEN DURING THIS VISIT:  Medications  ketorolac (TORADOL) 30 MG/ML injection 30 mg (30 mg Intravenous Given 02/01/19 0324)     ED Discharge Orders    None       Note:  This document was prepared using Dragon voice recognition software and may include unintentional dictation errors.   Gregor Hams, MD 02/01/19 [phone]

## 2019-02-01 NOTE — ED Notes (Addendum)
ED Provider at bedside.  Pt reports extensive hx of DDD and lower back pain and surgeries, reports having incontinence tonight "after peeing I stood and some more pee came out", "I called the office of my surgeon in [place] and they said come to the ED.  I have medicine to take but the nurse out front told me not to take my Neurontin because it could make me kill myself."  Dr Owens Shark oriented pt to take meds and possible benefits and risks,   Pt able to move slowly, CMS intact to distal extremities

## 2019-02-01 NOTE — ED Notes (Signed)
No peripheral IV placed this visit.   Discharge instructions reviewed with patient. Questions fielded by this RN. Patient verbalizes understanding of instructions. Patient discharged home in stable condition per Dr Owens Shark. No acute distress noted at time of discharge.

## 2019-02-01 NOTE — ED Notes (Signed)
Lab called for add on 

## 2019-02-02 LAB — URINE CULTURE

## 2019-02-08 ENCOUNTER — Ambulatory Visit
Admission: RE | Admit: 2019-02-08 | Discharge: 2019-02-08 | Disposition: A | Discharge status: Home / Self Care | Payer: Medicare Other | Source: Ambulatory Visit | Attending: Orthopaedic Surgery | Admitting: Orthopaedic Surgery

## 2019-02-08 DIAGNOSIS — M542 Cervicalgia: Secondary | ICD-10-CM | POA: Insufficient documentation

## 2019-02-08 DIAGNOSIS — M47892 Other spondylosis, cervical region: Secondary | ICD-10-CM | POA: Diagnosis present

## 2019-07-19 ENCOUNTER — Telehealth: Payer: Self-pay | Admitting: Family Medicine

## 2019-07-19 NOTE — Telephone Encounter (Signed)
Returned patient phone call. Patient states she has had a lice problem since 03/6567 and has cleaned her carpet, washed all of her bed linen as well as clothes. Patient states she has spent a lot of money on products and can't afford more. Patient requesting to speak to "someone who can help me." RN transferred phone call L.  Cletus Gash, Therapist, sports. Patient counseled per agency protocol regarding Lice treatment.  Hal Morales, RN

## 2019-07-19 NOTE — Progress Notes (Deleted)
Patient's Name: Megan Roth  MRN: [id number]  Referring Provider: Charlynn Court, [place]  DOB: 12-07-1965  PCP: Tracie Harrier, MD  DOS: 07/20/2019  Note by: Gillis Santa, MD  Service setting: Ambulatory outpatient  Specialty: Interventional Pain Management  Location: [facility] (AMB) Pain Management Facility  Visit type: Initial Patient Evaluation  Patient type: New Patient   Primary Reason(s) for Visit: Encounter for initial evaluation of one or more chronic problems (new to examiner) potentially causing chronic pain, and posing a threat to normal musculoskeletal function. (Level of risk: High) CC: No chief complaint on file.  HPI  Megan Roth is a 53 y.o. year old, female patient, who comes today to see [place] for the first time for an initial evaluation of her chronic pain. She has Failed back surgical syndrome; History of lumbar surgery; Lumbar radiculopathy; Lumbar degenerative disc disease; DDD (degenerative disc disease), cervical; Cervical radiculopathy; and Cervicalgia on their problem list. Today she comes in for evaluation of her No chief complaint on file.  Pain Assessment: Location:     Radiating:   Onset:   Duration:   Quality:   Severity:  /10 (subjective, self-reported pain score)  Note: Reported level is compatible with observation.                         When using our objective Pain Scale, levels between 6 and 10/10 are said to belong in an emergency room, as it progressively worsens from a 6/10, described as severely limiting, requiring emergency care not usually available at an outpatient pain management facility. At a 6/10 level, communication becomes difficult and requires great effort. Assistance to reach the emergency department may be required. Facial flushing and profuse sweating along with potentially dangerous increases in heart rate and blood pressure will be evident. Effect on ADL:   Timing:   Modifying factors:   BP:    HR:    Onset and Duration:  Gradual and Present longer than 3 months Cause of pain: Unknown Severity: Getting worse, NAS-11 at its worse: 10/10, NAS-11 at its best: 8/10, NAS-11 now: 8/10 and NAS-11 on the average: 8/10 Timing: Afternoon, Night and During activity or exercise Aggravating Factors: Bending, Climbing, Lifiting, Prolonged sitting, Twisting and Walking uphill Alleviating Factors: Stretching, Hot packs, Lying down, Medications, Resting, Sitting, Sleeping, TENS, Using a brace, Relaxation therapy and Warm showers or baths Associated Problems: Night-time cramps, Fatigue, Inability to concentrate, Nausea, Numbness, Spasms, Temperature changes, Vomiting , Weakness, Pain that wakes patient up and Pain that does not allow patient to sleep Quality of Pain: Aching, Agonizing, Annoying, Cramping, Deep, Disabling, Distressing, Exhausting, Feeling of constriction, Hot, Nagging, Pulsating, Sharp, Shooting, Stabbing, Tender, Throbbing, Tingling, Tiring and Uncomfortable Previous Examinations or Tests: CT scan, Ct-Myelogram, Endoscopy, MRI scan, Myelogram, Nerve block, X-rays, Nerve conduction test and Psychiatric evaluation Previous Treatments: Epidural steroid injections, Facet blocks, Narcotic medications, Physical Therapy, Pool exercises, Steroid treatments by mouth, Stretching exercises, TENS and Trigger point injections  The patient comes into the clinics today for the first time for a chronic pain management evaluation. ***  Today I took the time to provide the patient with information regarding my pain practice. The patient was informed that my practice is divided into two sections: an interventional pain management section, as well as a completely separate and distinct medication management section. I explained that I have procedure days for my interventional therapies, and evaluation days for follow-ups and medication management. Because of the amount of documentation required  during both, they are kept separated. This means  that there is the possibility that she may be scheduled for a procedure on one day, and medication management the next. I have also informed her that because of staffing and facility limitations, I no longer take patients for medication management only. To illustrate the reasons for this, I gave the patient the example of surgeons, and how inappropriate it would be to refer a patient to his/her care, just to write for the post-surgical antibiotics on a surgery done by a different surgeon.   Because interventional pain management is my board-certified specialty, the patient was informed that joining my practice means that they are open to any and all interventional therapies. I made it clear that this does not mean that they will be forced to have any procedures done. What this means is that I believe interventional therapies to be essential part of the diagnosis and proper management of chronic pain conditions. Therefore, patients not interested in these interventional alternatives will be better served under the care of a different practitioner.  The patient was also made aware of my Comprehensive Pain Management Safety Guidelines where by joining my practice, they limit all of their nerve blocks and joint injections to those done by our practice, for as long as we are retained to manage their care.   Historic Controlled Substance Pharmacotherapy Review  PMP and historical list of controlled substances: ***  Highest opioid analgesic regimen found: ***  Most recent opioid analgesic: ***  Current opioid analgesics:  ***  Highest recorded MME/day: *** mg/day MME/day: *** mg/day  Medications: The patient did not bring the medication(s) to the appointment, as requested in our "New Patient Package" Pharmacodynamics: Desired effects: Analgesia: The patient reports >50% benefit. Reported improvement in function: The patient reports medication allows her to accomplish basic ADLs. Clinically meaningful  improvement in function (CMIF): Sustained CMIF goals met Perceived effectiveness: Described as relatively effective, allowing for increase in activities of daily living (ADL) Undesirable effects: Side-effects or Adverse reactions: None reported Historical Monitoring: The patient  reports no history of drug use. List of all UDS Test(s): Lab Results  Component Value Date   MDMA NEGATIVE 12/31/2013   MDMA NEGATIVE 11/29/2012   MDMA NEGATIVE 01/06/2012   COCAINSCRNUR NEGATIVE 12/31/2013   COCAINSCRNUR NEGATIVE 11/29/2012   COCAINSCRNUR NEGATIVE 01/06/2012   COCAINSCRNUR NONE DETECTED 10/18/2008   PCPSCRNUR NEGATIVE 12/31/2013   PCPSCRNUR NEGATIVE 11/29/2012   PCPSCRNUR NEGATIVE 01/06/2012   THCU NEGATIVE 12/31/2013   THCU NEGATIVE 11/29/2012   THCU NEGATIVE 01/06/2012   THCU NONE DETECTED 10/18/2008   ETH  10/18/2008    <5        LOWEST DETECTABLE LIMIT FOR SERUM ALCOHOL IS 11 mg/dL FOR MEDICAL PURPOSES ONLY   List of other Serum/Urine Drug Screening Test(s):  Lab Results  Component Value Date   COCAINSCRNUR NEGATIVE 12/31/2013   COCAINSCRNUR NEGATIVE 11/29/2012   COCAINSCRNUR NEGATIVE 01/06/2012   COCAINSCRNUR NONE DETECTED 10/18/2008   THCU NEGATIVE 12/31/2013   THCU NEGATIVE 11/29/2012   THCU NEGATIVE 01/06/2012   THCU NONE DETECTED 10/18/2008   ETH  10/18/2008    <5        LOWEST DETECTABLE LIMIT FOR SERUM ALCOHOL IS 11 mg/dL FOR MEDICAL PURPOSES ONLY   Historical Background Evaluation: [place] PMP: PDMP not reviewed this encounter. Six (6) year initial data search conducted.             PMP NARX Score Report:  Narcotic: *** Sedative: *** Stimulant: ***  [place] Department of public safety, offender search: Editor, commissioning Information) Non-contributory Risk Assessment Profile: Aberrant behavior: None observed or detected today Risk factors for fatal opioid overdose: None identified today PMP NARX Overdose Risk Score: *** Fatal overdose hazard ratio (HR): Calculation  deferred Non-fatal overdose hazard ratio (HR): Calculation deferred Risk of opioid abuse or dependence: 0.7-3.0% with doses ? 36 MME/day and 6.1-26% with doses ? 120 MME/day. Substance use disorder (SUD) risk level: See below Personal History of Substance Abuse (SUD-Substance use disorder):  Alcohol:    Illegal Drugs:    Rx Drugs:    ORT Risk Level calculation:    ORT Scoring interpretation table:  Score <3 = Low Risk for SUD  Score between 4-7 = Moderate Risk for SUD  Score >8 = High Risk for Opioid Abuse   PHQ-2 Depression Scale:  Total score:    PHQ-2 Scoring interpretation table: (Score and probability of major depressive disorder)  Score 0 = No depression  Score 1 = 15.4% Probability  Score 2 = 21.1% Probability  Score 3 = 38.4% Probability  Score 4 = 45.5% Probability  Score 5 = 56.4% Probability  Score 6 = 78.6% Probability   PHQ-9 Depression Scale:  Total score:    PHQ-9 Scoring interpretation table:  Score 0-4 = No depression  Score 5-9 = Mild depression  Score 10-14 = Moderate depression  Score 15-19 = Moderately severe depression  Score 20-27 = Severe depression (2.4 times higher risk of SUD and 2.89 times higher risk of overuse)   Pharmacologic Plan: As per protocol, I have not taken over any controlled substance management, pending the results of ordered tests and/or consults.            Initial impression: Pending review of available data and ordered tests.  Meds   Current Outpatient Medications:  .  Biotin 10 MG TABS, Take 500 mg by mouth daily., Disp: , Rfl:  .  calcium-vitamin D (OSCAL WITH D) 500-200 MG-UNIT tablet, Take 1 tablet by mouth 2 (two) times daily., Disp: , Rfl:  .  cycloSPORINE (RESTASIS) 0.05 % ophthalmic emulsion, 1 drop 2 (two) times daily., Disp: , Rfl:  .  diazepam (VALIUM) 10 MG tablet, Take 10 mg by mouth 3 (three) times daily., Disp: , Rfl:  .  estrogens, conjugated, (PREMARIN) 0.625 MG tablet, Take 0.625 mg by mouth daily. Take daily  for 21 days then do not take for 7 days. , Disp: , Rfl:  .  estrogens, conjugated, (PREMARIN) 0.625 MG tablet, Take by mouth., Disp: , Rfl:  .  fluticasone (FLONASE) 50 MCG/ACT nasal spray, Place into the nose., Disp: , Rfl:  .  meloxicam (MOBIC) 15 MG tablet, Take 15 mg by mouth as needed. , Disp: , Rfl:  .  mirtazapine (REMERON) 15 MG tablet, Take 15 mg by mouth at bedtime as needed., Disp: , Rfl:  .  mirtazapine (REMERON) 30 MG tablet, , Disp: , Rfl:  .  morphine (KADIAN) 60 MG 24 hr capsule, Take 60 mg by mouth 2 (two) times daily., Disp: , Rfl:  .  Multiple Vitamins-Minerals (ALIVE ONCE DAILY WOMENS 50+ PO), Take 1 tablet by mouth daily., Disp: , Rfl:  .  naloxegol oxalate (MOVANTIK) 12.5 MG TABS tablet, Take 12.5 mg by mouth daily., Disp: , Rfl:  .  nicotine (NICOTROL) 10 MG inhaler, , Disp: , Rfl:  .  nortriptyline (PAMELOR) 10 MG capsule, Take 2 capsules by mouth at bedtime., Disp: , Rfl:  .  pantoprazole (PROTONIX) 40  MG tablet, Take 40 mg by mouth daily., Disp: , Rfl:  .  polyethylene glycol (MIRALAX / GLYCOLAX) packet, Take 17 g by mouth daily., Disp: , Rfl:  .  prochlorperazine (COMPAZINE) 10 MG tablet, Take 1 tablet (10 mg total) by mouth every 6 (six) hours as needed for nausea or vomiting., Disp: 12 tablet, Rfl: 0 .  ranitidine (ZANTAC) 300 MG capsule, Take 300 mg by mouth every evening., Disp: , Rfl:  .  senna (SENOKOT) 8.6 MG tablet, Take 2 tablets by mouth daily., Disp: , Rfl:  .  senna (SENOKOT) 8.6 MG tablet, , Disp: , Rfl:  .  tiZANidine (ZANAFLEX) 4 MG capsule, Take 4 mg by mouth 3 (three) times daily., Disp: , Rfl:  .  tiZANidine (ZANAFLEX) 4 MG tablet, , Disp: , Rfl:  .  traMADol (ULTRAM) 50 MG tablet, Take 50 mg by mouth every 8 (eight) hours as needed., Disp: , Rfl:   Imaging Review  Cervical Imaging: Cervical MR wo contrast:  Results for orders placed during the hospital encounter of 02/08/19  MR CERVICAL SPINE WO CONTRAST   Narrative CLINICAL DATA:  Neck pain,  bilateral hand and leg numbness for 1 year.  EXAM: MRI CERVICAL SPINE WITHOUT CONTRAST  TECHNIQUE: Multiplanar, multisequence MR imaging of the cervical spine was performed. No intravenous contrast was administered.  COMPARISON:  MRI of the cervical spine December 24, 2017  FINDINGS: ALIGNMENT: Straightened cervical lordosis.  No malalignment.  VERTEBRAE/DISCS: Vertebral bodies are intact. Similar moderate to severe C5-6 and C6-7 disc height loss with severe C5-6, moderate C4-5 and C6-7 chronic discogenic endplate changes. Mild acute discogenic endplate changes P6-1. Mild disc desiccation. No acute or abnormal bone marrow signal.  CORD:Cervical spinal cord is normal morphology and signal characteristics from the cervicomedullary junction to level of T2-3, the most caudal well visualized level.  POSTERIOR FOSSA, VERTEBRAL ARTERIES, PARASPINAL TISSUES: No MR findings of ligamentous injury. Vertebral artery flow voids present. Included posterior fossa and paraspinal soft tissues are normal.  DISC LEVELS:  C2-3: No disc bulge, canal stenosis nor neural foraminal narrowing. Mild facet arthropathy.  C3-4: Annular bulging, uncovertebral hypertrophy and moderate facet arthropathy. No canal stenosis. Mild neural foraminal narrowing.  C4-5: Small broad-based disc bulge, uncovertebral hypertrophy and moderate RIGHT facet arthropathy. Mild canal stenosis. Moderate RIGHT and mild LEFT neural foraminal narrowing.  C5-6: Moderate broad-based disc bulge, uncovertebral hypertrophy and mild facet arthropathy. Severe canal stenosis, AP dimension of the canal is 6 mm. Moderate RIGHT and moderate to severe LEFT neural foraminal narrowing.  C6-7: Small broad-based disc bulge, uncovertebral hypertrophy. Moderate canal stenosis. Moderate bilateral neural foraminal narrowing.  C7-T1: No disc bulge, canal stenosis nor neural foraminal narrowing.  IMPRESSION: 1. Progressed degenerative  change of the cervical spine. No fracture or malalignment. 2. Severe canal stenosis C5-6.  Moderate canal stenosis C6-7. 3. Neural foraminal narrowing C3-4 through C6-7: Moderate to severe on the LEFT at C5-6.   Electronically Signed   By: Elon Alas M.D.   On: 02/09/2019 02:29    Cervical MR wo contrast: No procedure found. Cervical MR w/wo contrast: No results found for this or any previous visit. Cervical MR w contrast: No results found for this or any previous visit. Cervical CT wo contrast: No results found for this or any previous visit. Cervical CT w/wo contrast: No results found for this or any previous visit. Cervical CT w/wo contrast: No results found for this or any previous visit. Cervical CT w contrast: No results found for  this or any previous visit. Cervical CT outside: No results found for this or any previous visit. Cervical DG 1 view: No results found for this or any previous visit. Cervical DG 2-3 views: No results found for this or any previous visit. Cervical DG F/E views: No results found for this or any previous visit. Cervical DG 2-3 clearing views: No results found for this or any previous visit. Cervical DG Bending/F/E views: No results found for this or any previous visit. Cervical DG complete: No results found for this or any previous visit. Cervical DG Myelogram views: No results found for this or any previous visit. Cervical DG Myelogram views: No results found for this or any previous visit. Cervical Discogram views: No results found for this or any previous visit.  Shoulder Imaging: Shoulder-R MR w contrast: No results found for this or any previous visit. Shoulder-L MR w contrast: No results found for this or any previous visit. Shoulder-R MR w/wo contrast: No results found for this or any previous visit. Shoulder-L MR w/wo contrast: No results found for this or any previous visit. Shoulder-R MR wo contrast: No results found for this or any  previous visit. Shoulder-L MR wo contrast: No results found for this or any previous visit. Shoulder-R CT w contrast: No results found for this or any previous visit. Shoulder-L CT w contrast: No results found for this or any previous visit. Shoulder-R CT w/wo contrast: No results found for this or any previous visit. Shoulder-L CT w/wo contrast: No results found for this or any previous visit. Shoulder-R CT wo contrast: No results found for this or any previous visit. Shoulder-L CT wo contrast: No results found for this or any previous visit. Shoulder-R DG Arthrogram: No results found for this or any previous visit. Shoulder-L DG Arthrogram: No results found for this or any previous visit. Shoulder-R DG 1 view: No results found for this or any previous visit. Shoulder-L DG 1 view: No results found for this or any previous visit. Shoulder-R DG: No results found for this or any previous visit. Shoulder-L DG: No results found for this or any previous visit.  Thoracic Imaging: Thoracic MR wo contrast: No results found for this or any previous visit. Thoracic MR wo contrast: No procedure found. Thoracic MR w/wo contrast: No results found for this or any previous visit. Thoracic MR w contrast: No results found for this or any previous visit. Thoracic CT wo contrast: No results found for this or any previous visit. Thoracic CT w/wo contrast: No results found for this or any previous visit. Thoracic CT w/wo contrast: No results found for this or any previous visit. Thoracic CT w contrast: No results found for this or any previous visit. Thoracic DG 2-3 views: No results found for this or any previous visit. Thoracic DG 4 views: No results found for this or any previous visit. Thoracic DG: No results found for this or any previous visit. Thoracic DG w/swimmers view: No results found for this or any previous visit. Thoracic DG Myelogram views: No results found for this or any previous  visit. Thoracic DG Myelogram views: No results found for this or any previous visit.  Lumbosacral Imaging: Lumbar MR wo contrast:  Results for orders placed during the hospital encounter of 01/29/19  MR LUMBAR SPINE WO CONTRAST   Narrative CLINICAL DATA:  Initial evaluation for chronic low back pain with bilateral leg numbness.  EXAM: MRI LUMBAR SPINE WITHOUT CONTRAST  TECHNIQUE: Multiplanar, multisequence MR imaging of the lumbar  spine was performed. No intravenous contrast was administered.  COMPARISON:  Previous MRI from 06/05/2018  FINDINGS: Segmentation: Standard. Lowest well-formed disc labeled the L5-S1 level.  Alignment: Physiologic with preservation of the normal lumbar lordosis. No listhesis.  Vertebrae: Susceptibility artifact from prior PLIF at L4-5 and L5-S1. Vertebral body heights maintained without evidence for acute or chronic fracture. Bone marrow signal intensity normal. No discrete or worrisome osseous lesions. No abnormal marrow edema.  Conus medullaris and cauda equina: Conus extends to the L1 level. Conus and cauda equina appear normal.  Paraspinal and other soft tissues: Paraspinous soft tissues demonstrate no acute finding. Visualized visceral structures unremarkable.  Disc levels:  L1-2:  Unremarkable.  L2-3: Mild disc bulge with disc desiccation. Mild bilateral facet hypertrophy. No stenosis or impingement. Appearance is stable from previous.  L3-4: Negative interspace. Mild bilateral facet hypertrophy. No stenosis or impingement. Appearance is stable from previous.  L4-5:  Prior PLIF.  No residual canal or foraminal stenosis.  L5-S1:  Prior PLIF.  No residual canal or foraminal stenosis.  IMPRESSION: 1. Stable MRI appearance of the lumbar spine as compared to 2019. 2. Previous PLIF at L4-5 and L5-S1 without residual or recurrent stenosis. 3. Mild bilateral facet hypertrophy at L2-3 and L3-4 without stenosis or  impingement.   Electronically Signed   By: Jeannine Boga M.D.   On: 01/30/2019 02:14    Lumbar MR wo contrast: No procedure found. Lumbar MR w/wo contrast:  Results for orders placed during the hospital encounter of 06/27/16  MR Lumbar Spine W Wo Contrast   Narrative CLINICAL DATA:  [place] 3 weeks ago. Persistent right-sided back pain and right leg pain. History of 3 prior lumbar surgeries. EXAM: MRI LUMBAR SPINE WITHOUT AND WITH CONTRAST TECHNIQUE: Multiplanar and multiecho pulse sequences of the lumbar spine were obtained without and with intravenous contrast. CONTRAST:  42m MULTIHANCE GADOBENATE DIMEGLUMINE 529 MG/ML IV SOLN COMPARISON:  MRI 12/08/2015 FINDINGS: Segmentation: 5 lumbar type vertebral bodies. The last full intervertebral disc space is labeled L5-S1. Alignment:  Normal and stable. Vertebrae:  Normal marrow signal.  No bone lesions or fracture. Conus medullaris: Extends to the L1 level and appears normal. Paraspinal and other soft tissues: Mild common bile duct dilatation is stable and likely related to prior cholecystectomy. Disc levels: L1-2:  No significant findings. L2-3:  No significant findings. L3-4: Moderate facet disease but no disc protrusions, spinal or foraminal stenosis. L4-5: Stable postoperative changes with wide decompressive laminectomy and posterior and interbody fusion changes. L5-S1: Wide decompressive laminectomy with posterior and interbody fusion changes. The interbody fusion devices protruding slightly posteriorly on the left. A comes close to the thecal sac but it has not changed and there is fat planes maintained around the left S1 nerve root. No foraminal stenosis. IMPRESSION: 1. Stable postoperative changes at L4-5 and L5-S1 with wide decompressive laminectomies, posterior and interbody fusion changes. 2. Stable slight posterior protrusion of the interbody fusion device on the left at L5-S1 along with some spurring change.  No direct neural compression. 3. No significant findings at the other intervertebral disc space levels. Electronically Signed   By: PMarijo SanesM.D.   On: 06/27/2016 10:02   Lumbar MR w/wo contrast: No results found for this or any previous visit. Lumbar MR w contrast: No results found for this or any previous visit. Lumbar CT wo contrast:  Results for orders placed during the hospital encounter of 11/20/09  CT Lumbar Spine Wo Contrast   Narrative Clinical Data: Previous fusion.  Back pain.  Recent fall. Bilateral leg pain.   CT LUMBAR SPINE WITHOUT CONTRAST   Technique:  Multidetector CT imaging of the lumbar spine was performed without intravenous contrast administration. Multiplanar CT image reconstructions were also generated.   Comparison: 08/31/2008   Findings: T12-L1 through L2-3:  No evidence of disc pathology.  No facet arthropathy or slippage.   L3-4:  No disc pathology.  Mild facet degeneration but no slippage or encroachment upon the neural spaces.   L4 to sacrum:  There is been posterior decompression and fusion. Fusion appears solid.  Since the previous study, pedicle screws on the right were extended through S1.  No sign of motion in the fusion segment.  No compressive stenosis is evident.   There are mild sacroiliac degenerative changes.   IMPRESSION: Good appearance of the fusion segment from L4 to the sacrum. Fusion appears solid.  No sign of ongoing motion.  No apparent stenosis or neural compression.   Minimal facet degeneration L3-4.  No slippage or stenosis.  Provider: Cleda Mccreedy   Lumbar CT w/wo contrast: No results found for this or any previous visit. Lumbar CT w/wo contrast: No results found for this or any previous visit. Lumbar CT w contrast: No results found for this or any previous visit. Lumbar DG 1V: No results found for this or any previous visit. Lumbar DG 1V (Clearing): No results found for this or any previous visit. Lumbar  DG 2-3V (Clearing): No results found for this or any previous visit. Lumbar DG 2-3 views: No results found for this or any previous visit. Lumbar DG (Complete) 4+V:  Results for orders placed during the hospital encounter of 07/05/08  DG Lumbar Spine Complete   Narrative Clinical Data: Pain   LUMBAR SPINE - COMPLETE 4+ VIEW   Comparison: None   Findings: Bilateral pedicle screws are present at L4 and L5.  A single left pedicle screw is present at S1.  Cross stabilizing bars are noted.  There is no vertebral body height loss.  There is anatomic alignment.  There is no breakage or loosening of the hardware.  Disc spacers are present in the 04/05 and 05/01 discs. A marker in the L5-S1 disc spacer projects posterior to the posterior margin of the L5 vertebral body.   IMPRESSION: L4-S1 posterior fusion as described. The L5-S1 disc spacer may be displaced posteriorly.  I have no prior films available for comparison.  Provider: Bryson Corona         Lumbar DG F/E views: No results found for this or any previous visit.       Lumbar DG Bending views: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

## 2019-07-20 ENCOUNTER — Ambulatory Visit
Payer: Medicare Other | Attending: Student in an Organized Health Care Education/Training Program | Admitting: Student in an Organized Health Care Education/Training Program

## 2019-07-20 ENCOUNTER — Encounter: Payer: Self-pay | Admitting: Student in an Organized Health Care Education/Training Program

## 2019-07-20 ENCOUNTER — Other Ambulatory Visit: Payer: Self-pay

## 2019-07-20 VITALS — BP 149/91 | HR 89 | Temp 98.1°F | Resp 16 | Ht 63.0 in | Wt 161.0 lb

## 2019-07-20 DIAGNOSIS — M542 Cervicalgia: Secondary | ICD-10-CM | POA: Diagnosis present

## 2019-07-20 DIAGNOSIS — G894 Chronic pain syndrome: Secondary | ICD-10-CM

## 2019-07-20 DIAGNOSIS — M5030 Other cervical disc degeneration, unspecified cervical region: Secondary | ICD-10-CM

## 2019-07-20 DIAGNOSIS — Z9889 Other specified postprocedural states: Secondary | ICD-10-CM | POA: Diagnosis not present

## 2019-07-20 DIAGNOSIS — M961 Postlaminectomy syndrome, not elsewhere classified: Secondary | ICD-10-CM

## 2019-07-20 DIAGNOSIS — M5136 Other intervertebral disc degeneration, lumbar region: Secondary | ICD-10-CM

## 2019-07-20 DIAGNOSIS — M5416 Radiculopathy, lumbar region: Secondary | ICD-10-CM

## 2019-07-20 DIAGNOSIS — M5412 Radiculopathy, cervical region: Secondary | ICD-10-CM

## 2019-07-20 NOTE — Progress Notes (Signed)
Safety precautions to be maintained throughout the outpatient stay will include: orient to surroundings, keep bed in low position, maintain call bell within reach at all times, provide assistance with transfer out of bed and ambulation.  

## 2019-07-20 NOTE — Progress Notes (Signed)
Patient's Name: Megan Roth  MRN: [id number]  Referring Provider: Charlynn Court, [place]  DOB: 10-02-1966  PCP: [facility]  DOS: 07/20/2019  Note by: Megan Santa, MD  Service setting: Ambulatory outpatient  Attending: Gillis Santa, MD  Location: [facility] (AMB) Pain Management Facility  Specialty: Interventional Pain Management  Patient type: Established   CC: Neck Pain and Back Pain (lower)  HPI: Ms. Megan Roth is a 53 y.o. year old, female patient, who comes today complaining of Neck Pain and Back Pain (lower) Her last contact with [place] was on 04/29/2018. I personally saw her on 04/29/2018. Severity of the pain is described as a 9 /10. Her pain is located in the area of the Neck   and both shoulders. Onset: More than a month ago. Pain is descriptors include: Throbbing. Tempo is described as: Constant. Modifying factors: lying in hot water.  Of note, patient was seen by me on 2 occasions 02/09/2018 and 04/29/2018.  Thereafter, no further visits.  She was a no-show for my 06/11/2018 appointment.  She is status post cervical spinal fusion: C4-C7 ACDF on 3/31 by Dr. Patrice Paradise .  Patient was being seen in [place] at [facility] with Dr. Mechele Dawley.  She was previously on Nucynta 50 mg every 4 hours as needed.  She states that this medication was not very effective.  Of note patient has tried multiple opioid medications in the past including extended release morphine, morphine IR, tramadol, oxycodone, hydrocodone, Nucynta.  Patient states that she would like to transfer her pain management care here.  Patient has also reduced her diazepam intake and takes a very sporadically and as needed for anxiety and panic attacks.  In regards to medication management, I informed the patient that the only chronic opioid analgesic that she would be a candidate for would be buprenorphine.  She has failed many opioid analgesics primarily short acting and long-acting medications via oral  route.  We discussed Butrans patch application as this is a milder acting opioid antagonist/antagonist with lesser abuse potential and lesser risks of physical and psychological dependence and less opioid related side effects.  Given that the patient has not seen me in over a year and given that I have not prescribed her any opioids in the past, she will need to complete a urine drug screen and referral to psychiatry for substance abuse evaluation.  Pending results can consider taking her on for buprenorphine therapy.  ROS: Positive for neck pain, shoulder stiffness, headaches, low back pain.  Exam: Ms. [name]  height is 5\' 3"  (1.6 m) and weight is 161 lb (73 kg). Her oral temperature is 98.1 F (36.7 C). Her blood pressure is 149/91 (abnormal) and her pulse is 89. Her respiration is 16 and oxygen saturation is 99%.  Body mass index is 28.52 kg/m.   Cervical Spine Area Exam  Skin & Axial Inspection: Well healed scar from previous spine surgery detected Alignment: Symmetrical Functional ROM: Pain restricted ROM     with cervical extension Stability: No instability detected Muscle Tone/Strength: Functionally intact. No obvious neuro-muscular anomalies detected. Sensory (Neurological): Musculoskeletal pain pattern  Upper Extremity (UE) Exam    Side: Right upper extremity  Side: Left upper extremity  Skin & Extremity Inspection: Skin color, temperature, and hair growth are WNL. No peripheral edema or cyanosis. No masses, redness, swelling, asymmetry, or associated skin lesions. No contractures.  Skin & Extremity Inspection: Skin color, temperature, and hair growth are WNL. No peripheral edema or cyanosis. No  masses, redness, swelling, asymmetry, or associated skin lesions. No contractures.  Functional ROM: Decreased ROM for all joints of upper extremity  Functional ROM: Decreased ROM for all joints of upper extremity  Muscle Tone/Strength: Functionally intact. No obvious neuro-muscular anomalies  detected.  Muscle Tone/Strength: Functionally intact. No obvious neuro-muscular anomalies detected.  Sensory (Neurological): Neuropathic pain pattern          Sensory (Neurological): Neuropathic pain pattern          Palpation: No palpable anomalies              Palpation: No palpable anomalies              Provocative Test(s):  Phalen's test: deferred Tinel's test: deferred Apley's scratch test (touch opposite shoulder):  Action 1 (Across chest): Decreased ROM Action 2 (Overhead): Decreased ROM Action 3 (LB reach): Decreased ROM   Provocative Test(s):  Phalen's test: deferred Tinel's test: deferred Apley's scratch test (touch opposite shoulder):  Action 1 (Across chest): Decreased ROM Action 2 (Overhead): Decreased ROM Action 3 (LB reach): Decreased ROM      A/P: The primary encounter diagnosis was Chronic pain syndrome. Diagnoses of Failed back surgical syndrome, History of lumbar surgery, Lumbar radiculopathy, Lumbar degenerative disc disease, DDD (degenerative disc disease), cervical, Cervical radiculopathy, and Cervicalgia were also pertinent to this visit. I have discontinued Lian Pounds. Morden "Darlene"'s mirtazapine, morphine, meloxicam, mirtazapine, nortriptyline, traMADol, naproxen, and traZODone. I am also having her maintain her estrogens (conjugated), tiZANidine, diazepam, polyethylene glycol, senna, Multiple Vitamins-Minerals (ALIVE ONCE DAILY WOMENS 50+ PO), pantoprazole, cycloSPORINE, estrogens (conjugated), senna, tiZANidine, nicotine, fluticasone, calcium-vitamin D, naloxegol oxalate, ranitidine, Biotin, prochlorperazine, and naloxone. Return patient will call to sch 2nd visit after pain psych.  Plan of Care  Orders:  Orders Placed This Encounter  Procedures  . Compliance Drug Analysis, Ur    Volume: 30 ml(s). Minimum 3 ml of urine is needed. Document temperature of fresh sample. Indications: Long term (current) use of opiate analgesic (Z79.891) Test#: [id number]  (Comprehensive Profile)  . Ambulatory referral to Psychology    Referral Priority:   Routine    Referral Type:   Psychiatric    Referral Reason:   Specialty Services Required    Requested Specialty:   Psychology    Number of Visits Requested:   1   Chronic Opioid Analgesic: I have not written any opioid prescriptions for her nor does she have a pain contract with our clinic.  Patient will call after she has been evaluated by psychiatry regarding her substance abuse disorder risk.  Follow-up plan:   Return patient will call to sch 2nd visit after pain psych.     Recent Visits No visits were found meeting these conditions.  Showing recent visits within past 90 days and meeting all other requirements   Today's Visits Date Type Provider Dept  07/20/19 Office Visit Megan Santa, MD [facility]  Showing today's visits and meeting all other requirements   Future Appointments No visits were found meeting these conditions.  Showing future appointments within next 90 days and meeting all other requirements   Disposition: Discharge home  Discharge Date & Time: 07/20/2019; 1030 hrs.   Primary Care Physician: [place] Location: [facility] Note by: Megan Santa, MD Date: 07/20/2019; Time: 4:00 PM

## 2019-07-23 LAB — COMPLIANCE DRUG ANALYSIS, UR

## 2019-10-22 ENCOUNTER — Other Ambulatory Visit: Payer: Self-pay | Admitting: Internal Medicine

## 2019-10-22 DIAGNOSIS — Z1231 Encounter for screening mammogram for malignant neoplasm of breast: Secondary | ICD-10-CM

## 2019-12-30 IMAGING — MR MRI CERVICAL SPINE WITHOUT CONTRAST
5 series · 38 of 48 positions shown · non-contrast
Comparison: MRI of the cervical spine December 24, 2017

CLINICAL DATA: Neck pain, bilateral hand and leg numbness for 1
year.

EXAM:
MRI CERVICAL SPINE WITHOUT CONTRAST
TECHNIQUE: Multiplanar, multisequence MR imaging of the cervical spine was
performed. No intravenous contrast was administered.

[Series 5: T2 · sagittal · 3.0mm · 0.62mm/px · 6 of 15 slices shown (1 of 2)]
[im 1/15]
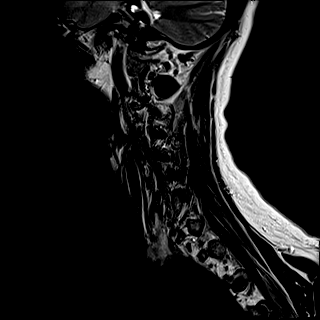
[im 3/15]
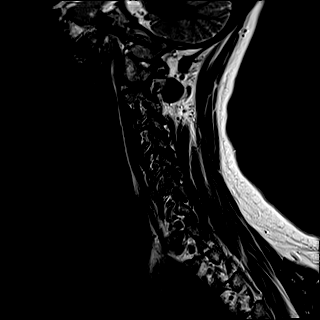
[im 6/15]
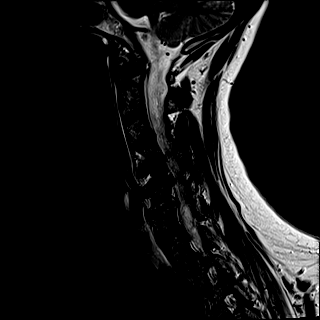
[im 9/15]
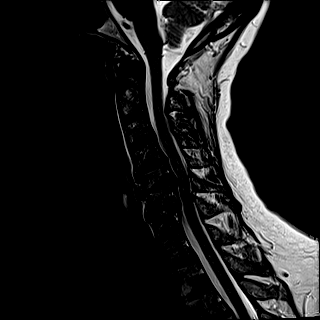
[im 12/15]
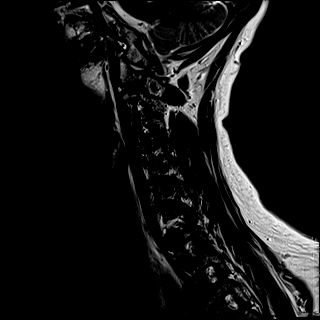
[im 15/15]
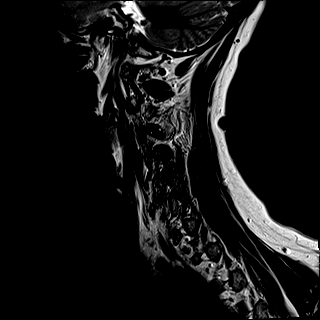

[Series 6: FLAIR · sagittal · 3.0mm · 0.78mm/px · 7 of 15 slices shown]
[im 1/15]
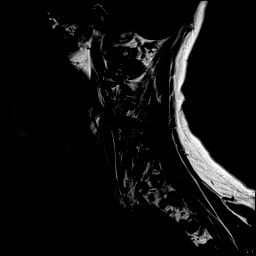
[im 3/15]
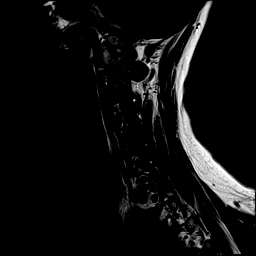
[im 5/15]
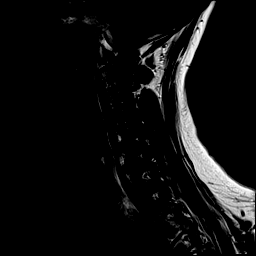
[im 8/15]
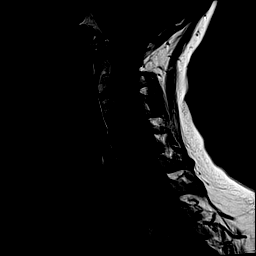
[im 10/15]
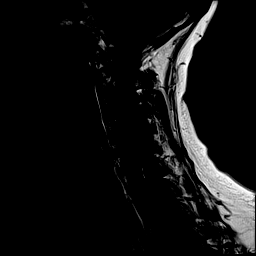
[im 12/15]
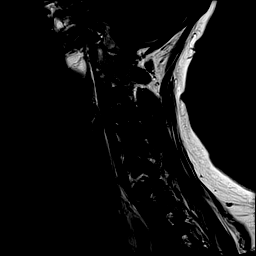
[im 15/15]
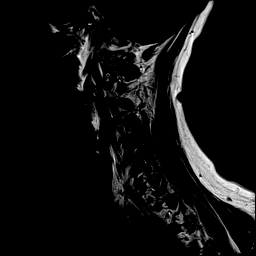

[Series 7: STIR · sagittal · 3.0mm · 0.62mm/px · 7 of 15 slices shown]
[im 1/15]
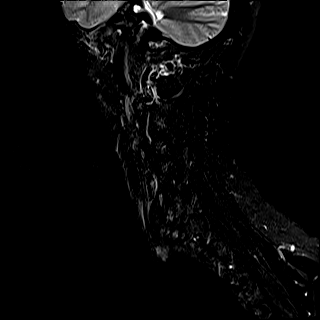
[im 3/15]
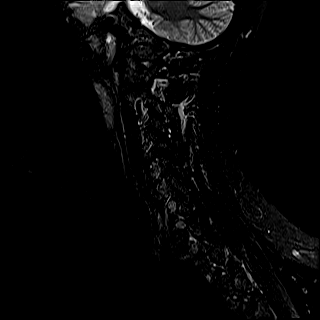
[im 5/15]
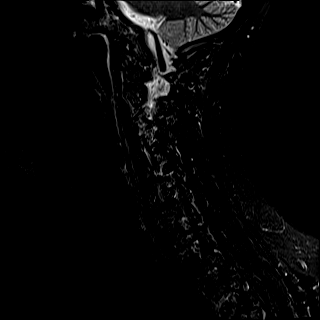
[im 8/15]
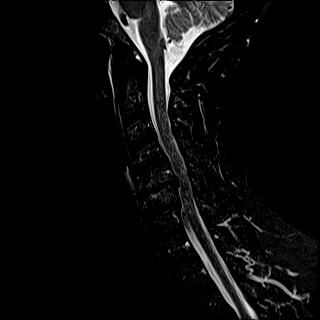
[im 10/15]
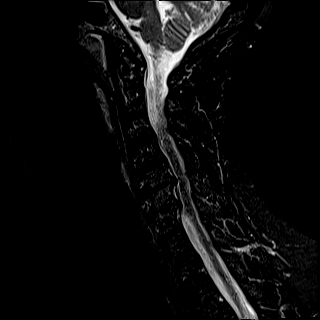
[im 12/15]
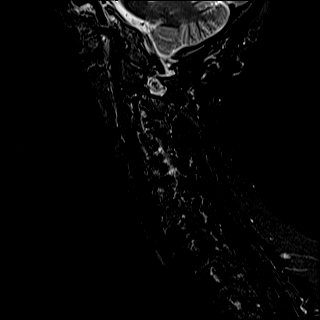
[im 15/15]
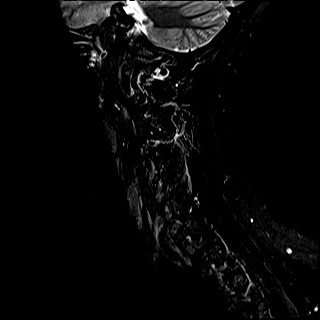

[Series 8: T2 · axial · 3.0mm · 0.70mm/px · z∈[-80,+14]mm · 10 of 29 slices shown (2 of 2)]
[im 1/29]
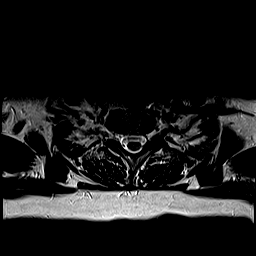
[im 3/29]
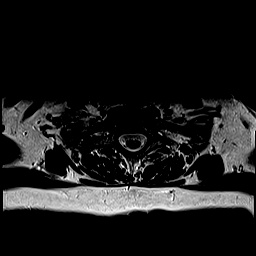
[im 5/29]
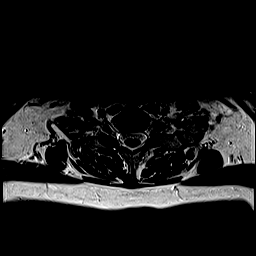
[im 7/29]
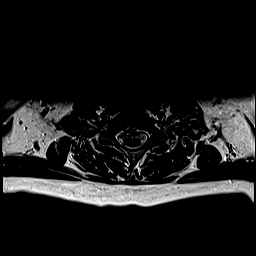
[im 9/29]
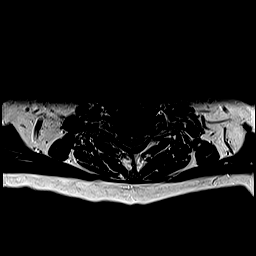
[im 13/29]
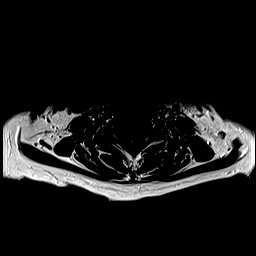
[im 16/29]
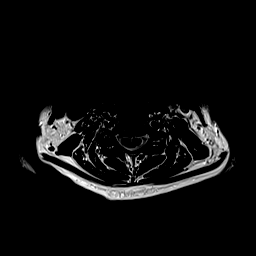
[im 20/29]
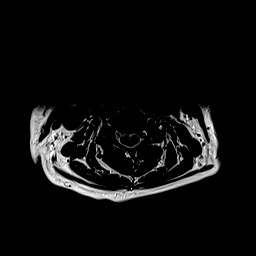
[im 24/29]
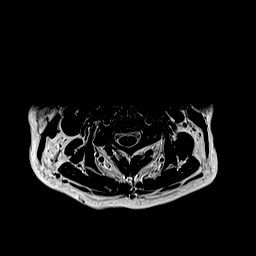
[im 29/29]
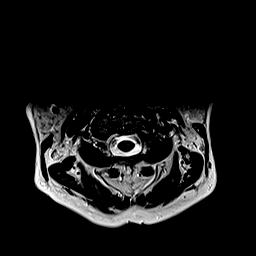

[Series 9: ax mpgr · axial · 3.0mm · 0.35mm/px · z∈[-80,+14]mm · 8 of 29 slices shown]
[im 1/29]
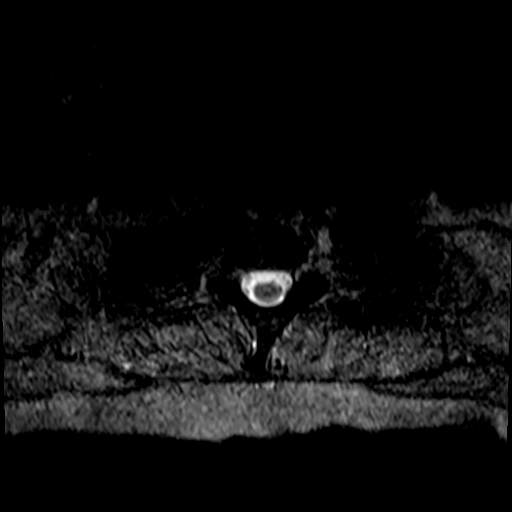
[im 5/29]
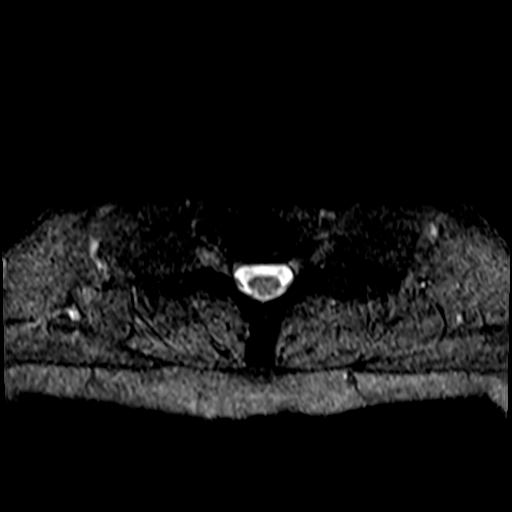
[im 9/29]
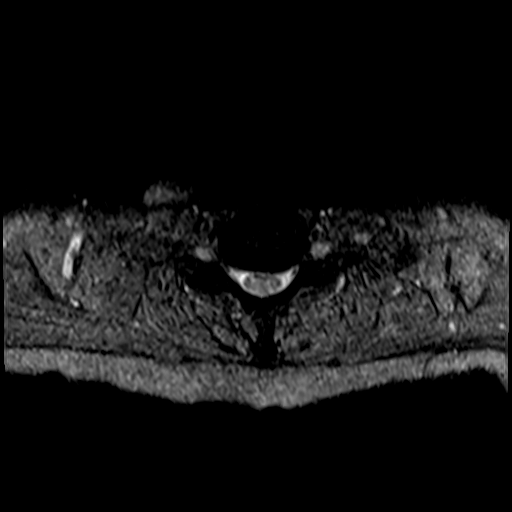
[im 13/29]
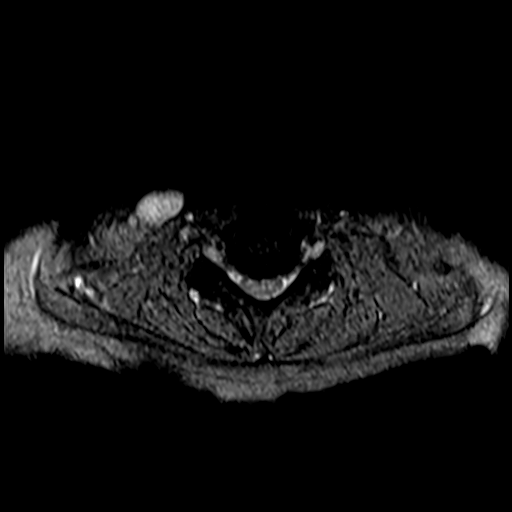
[im 16/29]
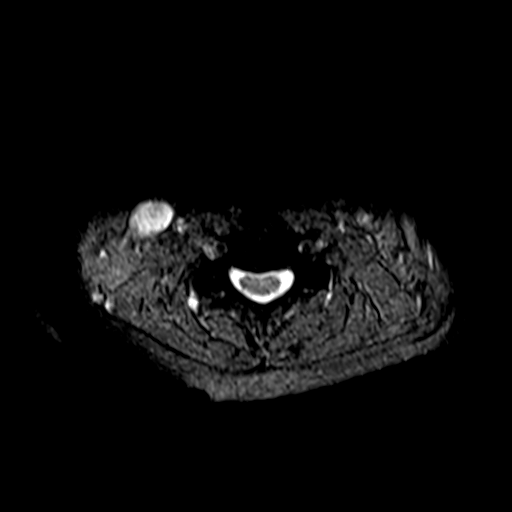
[im 20/29]
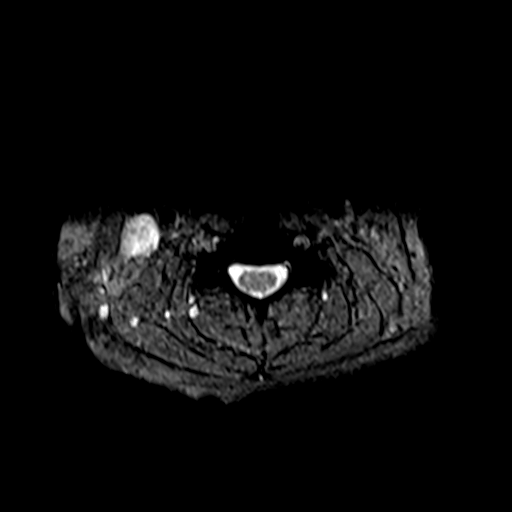
[im 24/29]
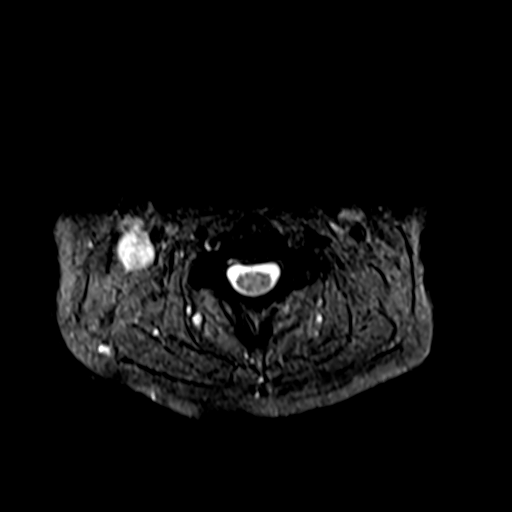
[im 29/29]
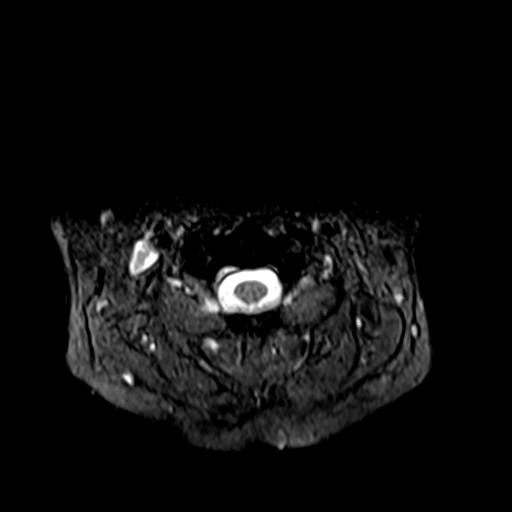

[38 of 48 positions shown; findings below may reference images not displayed]

FINDINGS: ALIGNMENT: Straightened cervical lordosis.  No malalignment.

VERTEBRAE/DISCS: Vertebral bodies are intact. Similar moderate to
severe C5-6 and C6-7 disc height loss with severe C5-6, moderate
C4-5 and C6-7 chronic discogenic endplate changes. Mild acute
discogenic endplate changes C5-6. Mild disc desiccation. No acute or
abnormal bone marrow signal.

CORD:Cervical spinal cord is normal morphology and signal
characteristics from the cervicomedullary junction to level of T2-3,
the most caudal well visualized level.

POSTERIOR FOSSA, VERTEBRAL ARTERIES, PARASPINAL TISSUES: No MR
findings of ligamentous injury. Vertebral artery flow voids present.
Included posterior fossa and paraspinal soft tissues are normal.

DISC LEVELS:

C2-3: No disc bulge, canal stenosis nor neural foraminal narrowing.
Mild facet arthropathy.

C3-4: Annular bulging, uncovertebral hypertrophy and moderate facet
arthropathy. No canal stenosis. Mild neural foraminal narrowing.

C4-5: Small broad-based disc bulge, uncovertebral hypertrophy and
moderate RIGHT facet arthropathy. Mild canal stenosis. Moderate
RIGHT and mild LEFT neural foraminal narrowing.

C5-6: Moderate broad-based disc bulge, uncovertebral hypertrophy and
mild facet arthropathy. Severe canal stenosis, AP dimension of the
canal is 6 mm. Moderate RIGHT and moderate to severe LEFT neural
foraminal narrowing.

C6-7: Small broad-based disc bulge, uncovertebral hypertrophy.
Moderate canal stenosis. Moderate bilateral neural foraminal
narrowing.

C7-T1: No disc bulge, canal stenosis nor neural foraminal narrowing.
IMPRESSION: 1. Progressed degenerative change of the cervical spine. No fracture
or malalignment.
2. Severe canal stenosis C5-6.  Moderate canal stenosis C6-7.
3. Neural foraminal narrowing C3-4 through C6-7: Moderate to severe
on the LEFT at C5-6.

## 2020-06-01 ENCOUNTER — Ambulatory Visit
Admission: RE | Admit: 2020-06-01 | Discharge: 2020-06-01 | Disposition: A | Discharge status: Home / Self Care | Payer: Medicare Other | Source: Ambulatory Visit | Attending: Internal Medicine | Admitting: Internal Medicine

## 2020-06-01 DIAGNOSIS — Z1231 Encounter for screening mammogram for malignant neoplasm of breast: Secondary | ICD-10-CM | POA: Insufficient documentation

## 2020-06-05 ENCOUNTER — Other Ambulatory Visit: Payer: Self-pay | Admitting: Dermatology

## 2020-12-06 ENCOUNTER — Other Ambulatory Visit: Payer: Self-pay | Admitting: Dermatology

## 2020-12-27 ENCOUNTER — Other Ambulatory Visit: Payer: Self-pay | Admitting: Orthopaedic Surgery

## 2020-12-27 DIAGNOSIS — M4322 Fusion of spine, cervical region: Secondary | ICD-10-CM

## 2021-01-11 ENCOUNTER — Other Ambulatory Visit: Payer: Self-pay

## 2021-01-11 ENCOUNTER — Ambulatory Visit
Admission: RE | Admit: 2021-01-11 | Discharge: 2021-01-11 | Disposition: A | Discharge status: Home / Self Care | Payer: Medicare Other | Source: Ambulatory Visit | Attending: Orthopaedic Surgery | Admitting: Orthopaedic Surgery

## 2021-01-11 DIAGNOSIS — M4322 Fusion of spine, cervical region: Secondary | ICD-10-CM | POA: Insufficient documentation

## 2021-07-25 ENCOUNTER — Other Ambulatory Visit: Payer: Self-pay | Admitting: Internal Medicine

## 2021-07-25 DIAGNOSIS — Z1231 Encounter for screening mammogram for malignant neoplasm of breast: Secondary | ICD-10-CM

## 2021-08-03 ENCOUNTER — Other Ambulatory Visit: Payer: Self-pay

## 2021-08-03 ENCOUNTER — Ambulatory Visit
Admission: RE | Admit: 2021-08-03 | Discharge: 2021-08-03 | Disposition: A | Discharge status: Home / Self Care | Payer: Medicare Other | Source: Ambulatory Visit | Attending: Internal Medicine | Admitting: Internal Medicine

## 2021-08-03 DIAGNOSIS — Z1231 Encounter for screening mammogram for malignant neoplasm of breast: Secondary | ICD-10-CM | POA: Diagnosis not present

## 2021-12-04 ENCOUNTER — Other Ambulatory Visit: Payer: Self-pay | Admitting: Internal Medicine

## 2021-12-04 DIAGNOSIS — R053 Chronic cough: Secondary | ICD-10-CM

## 2021-12-17 ENCOUNTER — Ambulatory Visit
Admission: RE | Admit: 2021-12-17 | Discharge: 2021-12-17 | Disposition: A | Discharge status: Home / Self Care | Payer: Medicare Other | Source: Ambulatory Visit | Attending: Internal Medicine | Admitting: Internal Medicine

## 2021-12-17 ENCOUNTER — Other Ambulatory Visit: Payer: Self-pay

## 2021-12-17 DIAGNOSIS — R053 Chronic cough: Secondary | ICD-10-CM | POA: Insufficient documentation

## 2022-03-01 ENCOUNTER — Other Ambulatory Visit: Payer: Self-pay | Admitting: Nurse Practitioner

## 2022-03-01 DIAGNOSIS — M546 Pain in thoracic spine: Secondary | ICD-10-CM

## 2022-03-01 DIAGNOSIS — M4322 Fusion of spine, cervical region: Secondary | ICD-10-CM

## 2022-04-15 ENCOUNTER — Other Ambulatory Visit: Payer: Self-pay | Admitting: Orthopaedic Surgery

## 2022-04-15 ENCOUNTER — Ambulatory Visit
Admission: RE | Admit: 2022-04-15 | Discharge: 2022-04-15 | Disposition: A | Discharge status: Home / Self Care | Payer: Medicare Other | Source: Ambulatory Visit | Attending: Nurse Practitioner | Admitting: Nurse Practitioner

## 2022-04-15 DIAGNOSIS — M4322 Fusion of spine, cervical region: Secondary | ICD-10-CM | POA: Insufficient documentation

## 2022-04-15 DIAGNOSIS — M546 Pain in thoracic spine: Secondary | ICD-10-CM | POA: Diagnosis present

## 2022-08-02 ENCOUNTER — Other Ambulatory Visit: Payer: Self-pay | Admitting: Internal Medicine

## 2022-08-23 ENCOUNTER — Other Ambulatory Visit: Payer: Self-pay | Admitting: Internal Medicine

## 2022-08-23 DIAGNOSIS — Z1231 Encounter for screening mammogram for malignant neoplasm of breast: Secondary | ICD-10-CM

## 2022-09-13 ENCOUNTER — Ambulatory Visit
Admission: RE | Admit: 2022-09-13 | Discharge: 2022-09-13 | Disposition: A | Discharge status: Home / Self Care | Payer: Medicare Other | Source: Ambulatory Visit | Attending: Internal Medicine | Admitting: Internal Medicine

## 2022-09-13 DIAGNOSIS — Z1231 Encounter for screening mammogram for malignant neoplasm of breast: Secondary | ICD-10-CM | POA: Diagnosis present

## 2022-11-01 ENCOUNTER — Emergency Department
Admission: EM | Admit: 2022-11-01 | Discharge: 2022-11-01 | Disposition: A | Discharge status: Home / Self Care | Payer: Medicare Other | Attending: Emergency Medicine | Admitting: Emergency Medicine

## 2022-11-01 ENCOUNTER — Other Ambulatory Visit: Payer: Self-pay

## 2022-11-01 ENCOUNTER — Emergency Department: Payer: Medicare Other

## 2022-11-01 DIAGNOSIS — D72829 Elevated white blood cell count, unspecified: Secondary | ICD-10-CM | POA: Insufficient documentation

## 2022-11-01 DIAGNOSIS — R509 Fever, unspecified: Secondary | ICD-10-CM

## 2022-11-01 DIAGNOSIS — B349 Viral infection, unspecified: Secondary | ICD-10-CM | POA: Insufficient documentation

## 2022-11-01 DIAGNOSIS — R059 Cough, unspecified: Secondary | ICD-10-CM | POA: Diagnosis present

## 2022-11-01 DIAGNOSIS — Z1152 Encounter for screening for COVID-19: Secondary | ICD-10-CM | POA: Diagnosis not present

## 2022-11-01 LAB — CBC WITH DIFFERENTIAL/PLATELET
Abs Immature Granulocytes: 0.12 10*3/uL — ABNORMAL HIGH (ref 0.00–0.07)
Basophils Absolute: 0.1 10*3/uL (ref 0.0–0.1)
Basophils Relative: 0 %
Eosinophils Absolute: 0 10*3/uL (ref 0.0–0.5)
Eosinophils Relative: 0 %
HCT: 40.4 % (ref 36.0–46.0)
Hemoglobin: 13.5 g/dL (ref 12.0–15.0)
Immature Granulocytes: 1 %
Lymphocytes Relative: 10 %
Lymphs Abs: 2 10*3/uL (ref 0.7–4.0)
MCH: 27.7 pg (ref 26.0–34.0)
MCHC: 33.4 g/dL (ref 30.0–36.0)
MCV: 82.8 fL (ref 80.0–100.0)
Monocytes Absolute: 1.2 10*3/uL — ABNORMAL HIGH (ref 0.1–1.0)
Monocytes Relative: 6 %
Neutro Abs: 15.8 10*3/uL — ABNORMAL HIGH (ref 1.7–7.7)
Neutrophils Relative %: 83 %
Platelets: 333 10*3/uL (ref 150–400)
RBC: 4.88 MIL/uL (ref 3.87–5.11)
RDW: 14.6 % (ref 11.5–15.5)
WBC: 19.2 10*3/uL — ABNORMAL HIGH (ref 4.0–10.5)
nRBC: 0 % (ref 0.0–0.2)

## 2022-11-01 LAB — LACTIC ACID, PLASMA: Lactic Acid, Venous: 1.5 mmol/L (ref 0.5–1.9)

## 2022-11-01 LAB — RESPIRATORY PANEL BY PCR

## 2022-11-01 LAB — RESP PANEL BY RT-PCR (FLU A&B, COVID) ARPGX2
Influenza A by PCR: NEGATIVE
Influenza B by PCR: NEGATIVE
SARS Coronavirus 2 by RT PCR: NEGATIVE

## 2022-11-01 LAB — URINALYSIS, ROUTINE W REFLEX MICROSCOPIC
Bacteria, UA: NONE SEEN
Bilirubin Urine: NEGATIVE
Glucose, UA: NEGATIVE mg/dL
Ketones, ur: 5 mg/dL — AB
Leukocytes,Ua: NEGATIVE
Nitrite: NEGATIVE
Protein, ur: NEGATIVE mg/dL
Specific Gravity, Urine: 1.015 (ref 1.005–1.030)
pH: 5 (ref 5.0–8.0)

## 2022-11-01 LAB — COMPREHENSIVE METABOLIC PANEL
ALT: 13 U/L (ref 0–44)
AST: 21 U/L (ref 15–41)
Albumin: 3.8 g/dL (ref 3.5–5.0)
Alkaline Phosphatase: 114 U/L (ref 38–126)
Anion gap: 11 (ref 5–15)
BUN: 8 mg/dL (ref 6–20)
CO2: 22 mmol/L (ref 22–32)
Calcium: 9 mg/dL (ref 8.9–10.3)
Chloride: 106 mmol/L (ref 98–111)
Creatinine, Ser: 0.73 mg/dL (ref 0.44–1.00)
GFR, Estimated: >60 mL/min (ref >60)
Glucose, Bld: 134 mg/dL — ABNORMAL HIGH (ref 70–99)
Potassium: 3.5 mmol/L (ref 3.5–5.1)
Sodium: 139 mmol/L (ref 135–145)
Total Bilirubin: 1.1 mg/dL (ref 0.3–1.2)
Total Protein: 7.5 g/dL (ref 6.5–8.1)

## 2022-11-01 LAB — PROCALCITONIN: Procalcitonin: <0.1 ng/mL

## 2022-11-01 LAB — GROUP A STREP BY PCR: Group A Strep by PCR: NOT DETECTED

## 2022-11-01 MED ORDER — AZITHROMYCIN 250 MG PO TABS: ORAL_TABLET | ORAL | 0 refills | Status: AC

## 2022-11-01 MED ORDER — LACTATED RINGERS IV BOLUS
1000.0000 mL | Freq: Once | INTRAVENOUS | Status: AC
  Administered 2022-11-01: 1000 mL via INTRAVENOUS

## 2022-11-01 MED ORDER — ACETAMINOPHEN 500 MG PO TABS
1000.0000 mg | ORAL_TABLET | Freq: Once | ORAL | Status: AC
  Administered 2022-11-01: 1000 mg via ORAL
  Filled 2022-11-01: qty 2

## 2022-11-01 MED ORDER — SODIUM CHLORIDE 0.9 % IV SOLN
2.0000 g | Freq: Once | INTRAVENOUS | Status: AC
  Administered 2022-11-01: 2 g via INTRAVENOUS
  Filled 2022-11-01: qty 20

## 2022-11-01 MED ORDER — AZITHROMYCIN 500 MG PO TABS
500.0000 mg | ORAL_TABLET | Freq: Once | ORAL | Status: AC
  Administered 2022-11-01: 500 mg via ORAL
  Filled 2022-11-01: qty 1

## 2022-11-01 MED ORDER — AMOXICILLIN-POT CLAVULANATE 875-125 MG PO TABS: 1.0000 | ORAL_TABLET | Freq: Two times a day (BID) | ORAL | 0 refills | Status: AC

## 2022-11-01 NOTE — ED Provider Notes (Signed)
[facility] Provider Note    Event Date/Time   First MD Initiated Contact with Patient 11/01/22 0413     (approximate)   History   Chills and Sore Throat   HPI  Megan Roth is a 56 y.o. female past medical history of generalized anxiety disorder depression who presents with cough fevers chills congestion.  Symptoms been going on for about a week.  He has sore throat body aches headache and fevers as high as 102.  Tells me this has been for a week.  She has cough but is not short of breath denies chest pain she denies any abdominal pain nausea vomiting or diarrhea denies urinary symptoms.  Has had decreased p.o. intake feels very weak.     Past Medical History:  Diagnosis Date   Back pain    Chronic constipation    Degenerative disc disease, lumbar    Depression    Fatigue    Gallbladder calculus    Generalized anxiety disorder    GERD (gastroesophageal reflux disease)    History of kidney stones    Hypercholesteremia    IDA (iron deficiency anemia)    Migraine    Numbness in feet    Ovarian cyst    Pain syndrome, chronic    Scoliosis    Seasonal allergies    Spasticity    Tension headache    Tobacco abuse     Patient Active Problem List   Diagnosis Date Noted   Chronic pain syndrome 07/20/2019   Failed back surgical syndrome 02/09/2018   History of lumbar surgery 02/09/2018   Lumbar radiculopathy 02/09/2018   Lumbar degenerative disc disease 02/09/2018   DDD (degenerative disc disease), cervical 02/09/2018   Cervical radiculopathy 02/09/2018   Cervicalgia 02/09/2018     Physical Exam  Triage Vital Signs: ED Triage Vitals  Enc Vitals Group     BP 11/01/22 0115 132/75     Pulse Rate 11/01/22 0115 (!) 112     Resp 11/01/22 0115 18     Temp 11/01/22 0115 100.3 F (37.9 C)     Temp Source 11/01/22 0115 Oral     SpO2 11/01/22 0115 94 %     Weight 11/01/22 0115 150 lb (68 kg)     Height 11/01/22 0115 '5\' 3"'$  (1.6 m)      Head Circumference --      Peak Flow --      Pain Score 11/01/22 0120 9     Pain Loc --      Pain Edu? --      Excl. in [place]? --     Most recent vital signs: Vitals:   11/01/22 0115 11/01/22 0600  BP: 132/75 128/77  Pulse: (!) 112 97  Resp: 18 18  Temp: 100.3 F (37.9 C) (!) 101.4 F (38.6 C)  SpO2: 94% 97%     General: Awake, she is nontoxic but does look like she feels unwell CV:  Good peripheral perfusion.  Resp:  Normal effort.  Lungs are clear no wheezing Abd:  No distention.  Abdomen is soft and nontender Neuro:             Awake, Alert, Oriented x 3  Other:  Erythema of the posterior oropharynx with slight exudate on the left tonsil uvula is midline no stridor no trismus   ED Results / Procedures / Treatments  Labs (all labs ordered are listed, but only abnormal results are displayed) Labs Reviewed  CBC WITH  DIFFERENTIAL/PLATELET - Abnormal; Notable for the following components:      Result Value   WBC 19.2 (*)    Neutro Abs 15.8 (*)    Monocytes Absolute 1.2 (*)    Abs Immature Granulocytes 0.12 (*)    All other components within normal limits  COMPREHENSIVE METABOLIC PANEL - Abnormal; Notable for the following components:   Glucose, Bld 134 (*)    All other components within normal limits  URINALYSIS, ROUTINE W REFLEX MICROSCOPIC - Abnormal; Notable for the following components:   Color, Urine YELLOW (*)    APPearance HAZY (*)    Hgb urine dipstick SMALL (*)    Ketones, ur 5 (*)    All other components within normal limits  GROUP A STREP BY PCR  RESP PANEL BY RT-PCR (FLU A&B, COVID) ARPGX2  CULTURE, BLOOD (ROUTINE X 2)  CULTURE, BLOOD (ROUTINE X 2)  RESPIRATORY PANEL BY PCR  LACTIC ACID, PLASMA  PROCALCITONIN  LACTIC ACID, PLASMA     EKG     RADIOLOGY I reviewed and interpreted the CXR which does not show any acute cardiopulmonary process    PROCEDURES:  Critical Care performed: No  Procedures   MEDICATIONS ORDERED IN  ED: Medications  cefTRIAXone (ROCEPHIN) 2 g in sodium chloride 0.9 % 100 mL IVPB (has no administration in time range)  azithromycin (ZITHROMAX) tablet 500 mg (has no administration in time range)  lactated ringers bolus 1,000 mL (1,000 mLs Intravenous New Bag/Given 11/01/22 0603)  acetaminophen (TYLENOL) tablet 1,000 mg (1,000 mg Oral Given 11/01/22 0603)     IMPRESSION / MDM / ASSESSMENT AND PLAN / ED COURSE  I reviewed the triage vital signs and the nursing notes.                              Patient's presentation is most consistent with acute complicated illness / injury requiring diagnostic workup.  Differential diagnosis includes, but is not limited to, viral illness including COVID-19, influenza, pneumonia, sepsis, bacteremia  The patient is a 56 year old female presenting with about a week of viral symptoms fevers.  Symptoms include cough sore throat headache body aches.  She has had fevers up to 102 and apparently this has been going on for a week.  Here her temp is 100.3 she is tachycardic sats 94%.  Patient looks nontoxic but does look like she feels unwell.  Her lungs are clear she has posterior oropharyngeal erythema and small amount of exudate.  She has no meningismus and is nontoxic-appearing.  Overall her clinical symptoms are most consistent with a viral process.  I did suspect influenza this was negative.  Will send in extended respiratory viral panel.  Given her tachycardia and fever will obtain labs including a lactate and blood cultures.  Will obtain urine chest x-ray.  Patient has leukocytosis to 19 the rest of her labs are reassuring normal CMP normal lactate UA is clear.  Chest x-ray clear, however given her duration of respiratory symptoms I think she likely clinically does have a pneumonia and will treat as such.  Patient given dose of Rocephin and azithromycin in the emergency department.  Will discharge with Augmentin and azithromycin for CAP.  Discussed with patient  that if symptoms or not improving or worsening she return to emergency department.  Patient understood and agreed with the plan.       FINAL CLINICAL IMPRESSION(S) / ED DIAGNOSES   Final diagnoses:  Fever, unspecified  fever cause  Viral syndrome     Rx / DC Orders   ED Discharge Orders          Ordered    amoxicillin-clavulanate (AUGMENTIN) 875-125 MG tablet  2 times daily        11/01/22 0728    azithromycin (ZITHROMAX) 250 MG tablet        11/01/22 5498             Note:  This document was prepared using Dragon voice recognition software and may include unintentional dictation errors.   Rada Hay, MD 11/01/22 [phone]

## 2022-11-01 NOTE — ED Notes (Signed)
This RN assisted the patient out of her 3 shirts and jacket due to her increase in temp. Pt provided a gown and Tylenol.

## 2022-11-01 NOTE — ED Triage Notes (Signed)
Pt comes from home via POV c/o sore throat, chills, cough. Pt states this has been going on for 3 days and is not getting better. Pt states having fevers at home, pt took tylenol at 4pm yesterday. Denies CP, SOB. NAD at this time.

## 2022-11-01 NOTE — Discharge Instructions (Addendum)
You likely have a viral illness but we are also giving you antibiotics to cover potential pneumonia.  Please take the 2 antibiotics for the next 5 days.  You can take Tylenol and Motrin for fever and pain.  If any of your symptoms are worsening you are unable to eat or drink you develop worsening shortness of breath please return to the emergency department.

## 2022-11-06 LAB — CULTURE, BLOOD (ROUTINE X 2)
Culture: NO GROWTH
Culture: NO GROWTH
Special Requests: ADEQUATE
Special Requests: ADEQUATE

## 2022-11-16 NOTE — Progress Notes (Signed)
Patient with viral symptoms fever elevated white count.  Patient's COVID and flu test was negative but I was still concerned for underlying viral illness which if we had confirmatory testing would make bacterial illness less likely which is why I sent the full viral panel.

## 2023-04-10 ENCOUNTER — Other Ambulatory Visit: Payer: Self-pay | Admitting: Orthopedic Surgery

## 2023-04-10 DIAGNOSIS — M542 Cervicalgia: Secondary | ICD-10-CM

## 2023-04-17 ENCOUNTER — Ambulatory Visit
Admission: RE | Admit: 2023-04-17 | Discharge: 2023-04-17 | Disposition: A | Discharge status: Home / Self Care | Payer: 59 | Source: Ambulatory Visit | Attending: Orthopedic Surgery | Admitting: Orthopedic Surgery

## 2023-04-17 DIAGNOSIS — M542 Cervicalgia: Secondary | ICD-10-CM | POA: Insufficient documentation

## 2023-08-08 ENCOUNTER — Other Ambulatory Visit: Payer: Self-pay | Admitting: Internal Medicine

## 2023-08-08 DIAGNOSIS — Z1231 Encounter for screening mammogram for malignant neoplasm of breast: Secondary | ICD-10-CM

## 2023-09-16 ENCOUNTER — Ambulatory Visit
Admission: RE | Admit: 2023-09-16 | Discharge: 2023-09-16 | Disposition: A | Discharge status: Home / Self Care | Payer: 59 | Source: Ambulatory Visit | Attending: Internal Medicine | Admitting: Internal Medicine

## 2023-09-16 DIAGNOSIS — Z1231 Encounter for screening mammogram for malignant neoplasm of breast: Secondary | ICD-10-CM | POA: Diagnosis present

## 2023-12-16 ENCOUNTER — Other Ambulatory Visit: Payer: Self-pay | Admitting: Orthopedic Surgery

## 2023-12-16 DIAGNOSIS — M4322 Fusion of spine, cervical region: Secondary | ICD-10-CM

## 2023-12-16 DIAGNOSIS — M542 Cervicalgia: Secondary | ICD-10-CM

## 2023-12-20 ENCOUNTER — Ambulatory Visit
Admission: RE | Admit: 2023-12-20 | Discharge: 2023-12-20 | Disposition: A | Discharge status: Home / Self Care | Payer: 59 | Source: Ambulatory Visit | Attending: Orthopedic Surgery | Admitting: Orthopedic Surgery

## 2023-12-20 DIAGNOSIS — M542 Cervicalgia: Secondary | ICD-10-CM | POA: Insufficient documentation

## 2024-01-28 ENCOUNTER — Other Ambulatory Visit: Payer: Self-pay | Admitting: Internal Medicine

## 2024-01-28 DIAGNOSIS — R7989 Other specified abnormal findings of blood chemistry: Secondary | ICD-10-CM

## 2024-02-01 ENCOUNTER — Emergency Department

## 2024-02-01 ENCOUNTER — Other Ambulatory Visit: Payer: Self-pay

## 2024-02-01 ENCOUNTER — Emergency Department
Admission: EM | Admit: 2024-02-01 | Discharge: 2024-02-01 | Disposition: A | Discharge status: Home / Self Care | Attending: Emergency Medicine | Admitting: Emergency Medicine

## 2024-02-01 DIAGNOSIS — R220 Localized swelling, mass and lump, head: Secondary | ICD-10-CM | POA: Diagnosis present

## 2024-02-01 DIAGNOSIS — H60502 Unspecified acute noninfective otitis externa, left ear: Secondary | ICD-10-CM

## 2024-02-01 DIAGNOSIS — L239 Allergic contact dermatitis, unspecified cause: Secondary | ICD-10-CM | POA: Diagnosis not present

## 2024-02-01 LAB — CBC
HCT: 40.5 % (ref 36.0–46.0)
Hemoglobin: 13.3 g/dL (ref 12.0–15.0)
MCH: 28.3 pg (ref 26.0–34.0)
MCHC: 32.8 g/dL (ref 30.0–36.0)
MCV: 86.2 fL (ref 80.0–100.0)
Platelets: 418 10*3/uL — ABNORMAL HIGH (ref 150–400)
RBC: 4.7 MIL/uL (ref 3.87–5.11)
RDW: 14.3 % (ref 11.5–15.5)
WBC: 8.4 10*3/uL (ref 4.0–10.5)
nRBC: 0 % (ref 0.0–0.2)

## 2024-02-01 LAB — BASIC METABOLIC PANEL
Anion gap: 12 (ref 5–15)
BUN: 12 mg/dL (ref 6–20)
CO2: 23 mmol/L (ref 22–32)
Calcium: 9.4 mg/dL (ref 8.9–10.3)
Chloride: 107 mmol/L (ref 98–111)
Creatinine, Ser: 0.65 mg/dL (ref 0.44–1.00)
GFR, Estimated: >60 mL/min (ref >60)
Glucose, Bld: 138 mg/dL — ABNORMAL HIGH (ref 70–99)
Potassium: 3.4 mmol/L — ABNORMAL LOW (ref 3.5–5.1)
Sodium: 142 mmol/L (ref 135–145)

## 2024-02-01 LAB — TROPONIN I (HIGH SENSITIVITY): Troponin I (High Sensitivity): 3 ng/L (ref <18)

## 2024-02-01 MED ORDER — CIPROFLOXACIN-DEXAMETHASONE 0.3-0.1 % OT SUSP
4.0000 [drp] | Freq: Two times a day (BID) | OTIC | 0 refills | Status: AC
Start: 2024-02-01 — End: ?

## 2024-02-01 MED ORDER — PREDNISONE 10 MG (21) PO TBPK
ORAL_TABLET | ORAL | 0 refills | Status: AC
Start: 2024-02-01 — End: ?

## 2024-02-01 MED ORDER — DEXAMETHASONE SODIUM PHOSPHATE 10 MG/ML IJ SOLN
10.0000 mg | Freq: Once | INTRAMUSCULAR | Status: AC
  Administered 2024-02-01: 10 mg via INTRAMUSCULAR
  Filled 2024-02-01: qty 1

## 2024-02-01 NOTE — ED Triage Notes (Signed)
 Patient reports possibly bit by something yesterday close to her right eye and it started swelling and now has swelling to bilateral periorbital.  Denies throat swelling or trouble swallowing.  Patient has a rash around her neck , face and left ear lobe Also complains of pressure in left chest.  Denies new soaps, lotions, detergent.  Did take benadryl last night with no relief.

## 2024-02-01 NOTE — Discharge Instructions (Signed)
 Please start the steroid pack tomorrow if the swelling has not gone down.  Use the ear drops for the next 5 days.  Return to the ER for symptoms that change or worsen if unable to see primary care.

## 2024-02-01 NOTE — ED Notes (Signed)
 See triage notes. Patient c/o possibly being bit by something yesterday close to her right eye. Patient now has swelling to the bilateral periorbital.

## 2024-02-01 NOTE — ED Provider Notes (Signed)
 [facility] Provider Note    Event Date/Time   First MD Initiated Contact with Patient 02/01/24 0710     (approximate)   History   Facial Swelling and Wheezing   HPI  Megan Roth is a 58 y.o. female  with history of IDA, DDD, Migraine and as listed in EMR presents to the emergency department for evaluation of facial swelling that started 2 days ago. She states it is possible she was bitten by something while outside, but isn't sure. Initial swelling was under the right eye and has now progressed under the left eye and to the back of her neck. No relief with medicated cream and Benadryl. She denies shortness of breath or sensation of throat/tongue/lip swelling.   She is also having drainage from the left ear and mild discomfort. These symptoms started about 5 days ago.      Physical Exam   Triage Vital Signs: ED Triage Vitals  Encounter Vitals Group     BP 02/01/24 0621 136/83     Systolic BP Percentile --      Diastolic BP Percentile --      Pulse Rate 02/01/24 0621 87     Resp 02/01/24 0621 18     Temp 02/01/24 0621 97.8 F (36.6 C)     Temp Source 02/01/24 0621 Oral     SpO2 02/01/24 0621 100 %     Weight 02/01/24 0619 150 lb (68 kg)     Height 02/01/24 0619 5\' 3"  (1.6 m)     Head Circumference --      Peak Flow --      Pain Score 02/01/24 0619 7     Pain Loc --      Pain Education --      Exclude from Growth Chart --     Most recent vital signs: Vitals:   02/01/24 0621  BP: 136/83  Pulse: 87  Resp: 18  Temp: 97.8 F (36.6 C)  SpO2: 100%    General: Awake, no distress.  CV:  Good peripheral perfusion.  Resp:  Normal effort.  Abd:  No distention.  Other:  Erythema, tiny vesicles and edema under eyes bilaterally. Vesicular lesions left posterior neck with erythematous base.   Honey colored drainage from the left ear. TM normal. No pain with movement of the auricle.   ED Results / Procedures / Treatments    Labs (all labs ordered are listed, but only abnormal results are displayed) Labs Reviewed  BASIC METABOLIC PANEL - Abnormal; Notable for the following components:      Result Value   Potassium 3.4 (*)    Glucose, Bld 138 (*)    All other components within normal limits  CBC - Abnormal; Notable for the following components:   Platelets 418 (*)    All other components within normal limits  TROPONIN I (HIGH SENSITIVITY)     EKG  Not indicated.   RADIOLOGY  Image and radiology report reviewed and interpreted by me. Radiology report consistent with the same.  Not indicated.  PROCEDURES:  Critical Care performed: No  Procedures   MEDICATIONS ORDERED IN ED:  Medications  dexamethasone (DECADRON) injection 10 mg (10 mg Intramuscular Given 02/01/24 0729)     IMPRESSION / MDM / ASSESSMENT AND PLAN / ED COURSE   I have reviewed the triage note.  Differential diagnosis includes, but is not limited to, allergic reaction, contact dermatitis  Patient's presentation is most consistent with acute illness /  injury with system symptoms.  58 year old female presenting to the emergency department for evaluation of facial swelling and drainage from the left ear.  See HPI for further details.  On exam, she does have swelling under both eyes with tiny vesicles on an erythematous base.  Same type rash on the left side of her neck.  She has had some mild itching but has been taking Benadryl and using medicated cream.  Symptoms seem to have worsened overnight.  Plan will be to give her Decadron injection here and have her start Zantac and continue Benadryl.  If her symptoms are not improving after 24 hours, she will be advised to start prednisone pack in addition to the Benadryl and Zantac.  She was given ER return precautions.      FINAL CLINICAL IMPRESSION(S) / ED DIAGNOSES   Final diagnoses:  Allergic contact dermatitis, unspecified trigger     Rx / DC Orders   ED  Discharge Orders          Ordered    predniSONE (STERAPRED UNI-PAK 21 TAB) 10 MG (21) TBPK tablet        02/01/24 0739    ciprofloxacin-dexamethasone (CIPRODEX) OTIC suspension  2 times daily        02/01/24 1610             Note:  This document was prepared using Dragon voice recognition software and may include unintentional dictation errors.   Chinita Pester, FNP 02/01/24 9604    Merwyn Katos, MD 02/01/24 [phone]

## 2024-02-02 ENCOUNTER — Ambulatory Visit
Admission: RE | Admit: 2024-02-02 | Discharge: 2024-02-02 | Disposition: A | Discharge status: Home / Self Care | Payer: 59 | Source: Ambulatory Visit | Attending: Internal Medicine | Admitting: Internal Medicine

## 2024-02-02 DIAGNOSIS — R7989 Other specified abnormal findings of blood chemistry: Secondary | ICD-10-CM | POA: Insufficient documentation

## 2024-02-04 ENCOUNTER — Encounter: Payer: Self-pay | Admitting: Dermatology

## 2024-02-04 ENCOUNTER — Ambulatory Visit: Payer: 59 | Admitting: Dermatology

## 2024-02-04 DIAGNOSIS — L820 Inflamed seborrheic keratosis: Secondary | ICD-10-CM | POA: Diagnosis not present

## 2024-02-04 DIAGNOSIS — L578 Other skin changes due to chronic exposure to nonionizing radiation: Secondary | ICD-10-CM

## 2024-02-04 DIAGNOSIS — L219 Seborrheic dermatitis, unspecified: Secondary | ICD-10-CM

## 2024-02-04 DIAGNOSIS — R21 Rash and other nonspecific skin eruption: Secondary | ICD-10-CM

## 2024-02-04 DIAGNOSIS — Z7189 Other specified counseling: Secondary | ICD-10-CM

## 2024-02-04 DIAGNOSIS — L0100 Impetigo, unspecified: Secondary | ICD-10-CM

## 2024-02-04 DIAGNOSIS — L814 Other melanin hyperpigmentation: Secondary | ICD-10-CM | POA: Diagnosis not present

## 2024-02-04 DIAGNOSIS — S1096XS Insect bite of unspecified part of neck, sequela: Secondary | ICD-10-CM

## 2024-02-04 DIAGNOSIS — L011 Impetiginization of other dermatoses: Secondary | ICD-10-CM

## 2024-02-04 DIAGNOSIS — L821 Other seborrheic keratosis: Secondary | ICD-10-CM | POA: Diagnosis not present

## 2024-02-04 DIAGNOSIS — W908XXA Exposure to other nonionizing radiation, initial encounter: Secondary | ICD-10-CM | POA: Diagnosis not present

## 2024-02-04 DIAGNOSIS — Z79899 Other long term (current) drug therapy: Secondary | ICD-10-CM

## 2024-02-04 DIAGNOSIS — S00472A Other superficial bite of left ear, initial encounter: Secondary | ICD-10-CM

## 2024-02-04 MED ORDER — MUPIROCIN 2 % EX OINT: TOPICAL_OINTMENT | CUTANEOUS | 0 refills | Status: AC

## 2024-02-04 MED ORDER — CEPHALEXIN 500 MG PO CAPS: ORAL_CAPSULE | ORAL | 0 refills | Status: AC

## 2024-02-04 MED ORDER — KETOCONAZOLE 2 % EX SHAM: MEDICATED_SHAMPOO | CUTANEOUS | 11 refills | Status: AC

## 2024-02-04 NOTE — Progress Notes (Signed)
 New Patient Visit   Subjective  Megan Roth is a 58 y.o. female who presents for the following: New irregular skin lesion on the chest, has been there for years, but started changing in appearance.  The patient has spots, moles and lesions to be evaluated, some may be new or changing and the patient may have concern these could be cancer.  The following portions of the chart were reviewed this encounter and updated as appropriate: medications, allergies, medical history  Review of Systems:  No other skin or systemic complaints except as noted in HPI or Assessment and Plan.  Objective  Well appearing patient in no apparent distress; mood and affect are within normal limits.  A focused examination was performed of the following areas: the chest, face, arms, hands  Relevant exam findings are noted in the Assessment and Plan.  R chest x 1, R arm x 2 (3) Erythematous stuck-on, waxy papule or plaque  Assessment & Plan   SEBORRHEIC KERATOSIS - Stuck-on, waxy, tan-brown papules and/or plaques  - Benign-appearing - Discussed benign etiology and prognosis. - Observe - Call for any changes  ACTINIC DAMAGE - severe - chronic, secondary to cumulative UV radiation exposure/sun exposure over time - diffuse scaly erythematous macules with underlying dyspigmentation - Recommend daily broad spectrum sunscreen SPF 30+ to sun-exposed areas, reapply every 2 hours as needed.  - Recommend staying in the shade or wearing long sleeves, sun glasses (UVA+UVB protection) and wide brim hats (4-inch brim around the entire circumference of the hat). - Call for new or changing lesions.  INFLAMED SEBORRHEIC KERATOSIS (3) R chest x 1, R arm x 2 (3) Symptomatic, irritating, patient would like treated. Discussed risk of hypopigmentation after LN2.   Destruction of lesion - R chest x 1, R arm x 2 (3) Complexity: simple   Destruction method: cryotherapy   Informed consent: discussed and consent  obtained   Timeout:  patient name, date of birth, surgical site, and procedure verified Lesion destroyed using liquid nitrogen: Yes   Region frozen until ice ball extended beyond lesion: Yes   Outcome: patient tolerated procedure well with no complications   Post-procedure details: wound care instructions given   ACTINIC SKIN DAMAGE   LENTIGO   SEBORRHEIC KERATOSIS   SEBORRHEIC DERMATITIS    LENTIGINES Exam: scattered tan macules Due to sun exposure Treatment Plan: Benign-appearing, observe. Recommend daily broad spectrum sunscreen SPF 30+ to sun-exposed areas, reapply every 2 hours as needed.  Call for any changes  Rash - allergic reaction to bite with impetiginization, currently on Prednisone      Exam: L ear crusting, see photo  Treatment Plan: Start Mupirocin 2% ointment to aa's BID x 2 weeks. If not clearing with Mupirocin 2% ointment start Cephalexin 500 mg po BIDx x 10 days.   SEBORRHEIC DERMATITIS Exam: Pink patches with greasy scale at scalp Chronic and persistent condition with duration or expected duration over one year. Condition is symptomatic / bothersome to patient. Not to goal. Seborrheic Dermatitis is a chronic persistent rash characterized by pinkness and scaling most commonly of the mid face but also can occur on the scalp (dandruff), ears; mid chest, mid back and groin.  It tends to be exacerbated by stress and cooler weather.  People who have neurologic disease may experience new onset or exacerbation of existing seborrheic dermatitis.  The condition is not curable but treatable and can be controlled.  Treatment Plan: Start Ketoconazole 2% shampoo. Massage into scalp let sit 5-10 minutes  then wash out. Use 2-3 days per week.     Return if symptoms worsen or fail to improve.  Megan Roth, CMA, am acting as scribe for Armida Sans, MD .   Documentation: I have reviewed the above documentation for accuracy and completeness, and I agree with the  above.  Armida Sans, MD

## 2024-02-04 NOTE — Patient Instructions (Signed)

## 2024-09-09 ENCOUNTER — Other Ambulatory Visit: Payer: Self-pay | Admitting: Internal Medicine

## 2024-09-09 DIAGNOSIS — Z1231 Encounter for screening mammogram for malignant neoplasm of breast: Secondary | ICD-10-CM

## 2024-10-11 ENCOUNTER — Ambulatory Visit
Admission: RE | Admit: 2024-10-11 | Discharge: 2024-10-11 | Disposition: A | Discharge status: Home / Self Care | Source: Ambulatory Visit | Attending: Internal Medicine | Admitting: Internal Medicine

## 2024-10-11 DIAGNOSIS — Z1231 Encounter for screening mammogram for malignant neoplasm of breast: Secondary | ICD-10-CM | POA: Insufficient documentation

## 2024-10-18 ENCOUNTER — Other Ambulatory Visit: Payer: Self-pay | Admitting: Internal Medicine

## 2024-10-18 DIAGNOSIS — R928 Other abnormal and inconclusive findings on diagnostic imaging of breast: Secondary | ICD-10-CM

## 2024-10-19 ENCOUNTER — Ambulatory Visit
Admission: RE | Admit: 2024-10-19 | Discharge: 2024-10-19 | Disposition: A | Discharge status: Home / Self Care | Source: Ambulatory Visit | Attending: Internal Medicine | Admitting: Internal Medicine

## 2024-10-19 DIAGNOSIS — R928 Other abnormal and inconclusive findings on diagnostic imaging of breast: Secondary | ICD-10-CM | POA: Insufficient documentation
